# Patient Record
Sex: Female | Born: 1965 | Race: Black or African American | Hispanic: No | Marital: Married | Smoking: Never smoker
Health system: Southern US, Community
[De-identification: ages and names within clinical notes are randomized; demographics above are authoritative.]

## PROBLEM LIST (undated history)

## (undated) DIAGNOSIS — Z8349 Family history of other endocrine, nutritional and metabolic diseases: Secondary | ICD-10-CM

## (undated) DIAGNOSIS — R58 Hemorrhage, not elsewhere classified: Secondary | ICD-10-CM

## (undated) DIAGNOSIS — Z833 Family history of diabetes mellitus: Secondary | ICD-10-CM

## (undated) DIAGNOSIS — B977 Papillomavirus as the cause of diseases classified elsewhere: Secondary | ICD-10-CM

## (undated) DIAGNOSIS — Z823 Family history of stroke: Secondary | ICD-10-CM

## (undated) DIAGNOSIS — Z8261 Family history of arthritis: Secondary | ICD-10-CM

## (undated) DIAGNOSIS — IMO0002 Reserved for concepts with insufficient information to code with codable children: Secondary | ICD-10-CM

## (undated) DIAGNOSIS — R634 Abnormal weight loss: Secondary | ICD-10-CM

## (undated) DIAGNOSIS — D219 Benign neoplasm of connective and other soft tissue, unspecified: Secondary | ICD-10-CM

## (undated) DIAGNOSIS — B9689 Other specified bacterial agents as the cause of diseases classified elsewhere: Secondary | ICD-10-CM

## (undated) DIAGNOSIS — N644 Mastodynia: Secondary | ICD-10-CM

## (undated) DIAGNOSIS — Z8742 Personal history of other diseases of the female genital tract: Secondary | ICD-10-CM

## (undated) DIAGNOSIS — Z8719 Personal history of other diseases of the digestive system: Secondary | ICD-10-CM

## (undated) DIAGNOSIS — N760 Acute vaginitis: Secondary | ICD-10-CM

## (undated) DIAGNOSIS — Z8249 Family history of ischemic heart disease and other diseases of the circulatory system: Secondary | ICD-10-CM

## (undated) DIAGNOSIS — D649 Anemia, unspecified: Secondary | ICD-10-CM

## (undated) DIAGNOSIS — R102 Pelvic and perineal pain: Secondary | ICD-10-CM

## (undated) DIAGNOSIS — I471 Supraventricular tachycardia, unspecified: Secondary | ICD-10-CM

## (undated) DIAGNOSIS — A630 Anogenital (venereal) warts: Secondary | ICD-10-CM

## (undated) DIAGNOSIS — L659 Nonscarring hair loss, unspecified: Secondary | ICD-10-CM

## (undated) DIAGNOSIS — N9089 Other specified noninflammatory disorders of vulva and perineum: Secondary | ICD-10-CM

## (undated) DIAGNOSIS — N840 Polyp of corpus uteri: Secondary | ICD-10-CM

## (undated) DIAGNOSIS — N926 Irregular menstruation, unspecified: Secondary | ICD-10-CM

## (undated) DIAGNOSIS — Z8619 Personal history of other infectious and parasitic diseases: Secondary | ICD-10-CM

## (undated) DIAGNOSIS — R87619 Unspecified abnormal cytological findings in specimens from cervix uteri: Secondary | ICD-10-CM

## (undated) DIAGNOSIS — Z840 Family history of diseases of the skin and subcutaneous tissue: Secondary | ICD-10-CM

## (undated) HISTORY — DX: Family history of ischemic heart disease and other diseases of the circulatory system: Z82.49

## (undated) HISTORY — DX: Abnormal weight loss: R63.4

## (undated) HISTORY — DX: Other specified noninflammatory disorders of vulva and perineum: N90.89

## (undated) HISTORY — DX: Family history of stroke: Z82.3

## (undated) HISTORY — PX: BREAST CYST ASPIRATION: SHX578

## (undated) HISTORY — DX: Unspecified abnormal cytological findings in specimens from cervix uteri: R87.619

## (undated) HISTORY — DX: Anemia, unspecified: D64.9

## (undated) HISTORY — PX: HERNIA REPAIR: SHX51

## (undated) HISTORY — DX: Pelvic and perineal pain: R10.2

## (undated) HISTORY — DX: Hemorrhage, not elsewhere classified: R58

## (undated) HISTORY — DX: Family history of other endocrine, nutritional and metabolic diseases: Z83.49

## (undated) HISTORY — DX: Reserved for concepts with insufficient information to code with codable children: IMO0002

## (undated) HISTORY — DX: Polyp of corpus uteri: N84.0

## (undated) HISTORY — DX: Family history of diabetes mellitus: Z83.3

## (undated) HISTORY — DX: Family history of arthritis: Z82.61

## (undated) HISTORY — DX: Anogenital (venereal) warts: A63.0

## (undated) HISTORY — DX: Benign neoplasm of connective and other soft tissue, unspecified: D21.9

## (undated) HISTORY — DX: Acute vaginitis: N76.0

## (undated) HISTORY — DX: Mastodynia: N64.4

## (undated) HISTORY — DX: Papillomavirus as the cause of diseases classified elsewhere: B97.7

## (undated) HISTORY — DX: Family history of diseases of the skin and subcutaneous tissue: Z84.0

## (undated) HISTORY — DX: Personal history of other diseases of the digestive system: Z87.19

## (undated) HISTORY — DX: Other specified bacterial agents as the cause of diseases classified elsewhere: B96.89

## (undated) HISTORY — DX: Personal history of other diseases of the female genital tract: Z87.42

## (undated) HISTORY — DX: Nonscarring hair loss, unspecified: L65.9

## (undated) HISTORY — DX: Irregular menstruation, unspecified: N92.6

## (undated) HISTORY — PX: ABDOMINOPLASTY: SHX5355

## (undated) HISTORY — DX: Personal history of other infectious and parasitic diseases: Z86.19

---
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
## 1988-03-30 HISTORY — PX: WISDOM TOOTH EXTRACTION: SHX21

## 1997-12-10 ENCOUNTER — Other Ambulatory Visit: Admission: RE | Admit: 1997-12-10 | Discharge: 1997-12-10 | Payer: Self-pay | Admitting: Obstetrics & Gynecology

## 1998-07-26 ENCOUNTER — Ambulatory Visit (HOSPITAL_COMMUNITY): Admission: RE | Admit: 1998-07-26 | Discharge: 1998-07-26 | Payer: Self-pay | Admitting: Gynecology

## 1998-10-16 ENCOUNTER — Encounter: Payer: Self-pay | Admitting: Obstetrics and Gynecology

## 1998-10-16 ENCOUNTER — Ambulatory Visit (HOSPITAL_COMMUNITY): Admission: RE | Admit: 1998-10-16 | Discharge: 1998-10-16 | Payer: Self-pay | Admitting: Obstetrics and Gynecology

## 1998-12-27 ENCOUNTER — Encounter: Payer: Self-pay | Admitting: Obstetrics & Gynecology

## 1998-12-27 ENCOUNTER — Ambulatory Visit (HOSPITAL_COMMUNITY): Admission: RE | Admit: 1998-12-27 | Discharge: 1998-12-27 | Payer: Self-pay | Admitting: Obstetrics & Gynecology

## 1998-12-28 ENCOUNTER — Observation Stay (HOSPITAL_COMMUNITY): Admission: EM | Admit: 1998-12-28 | Discharge: 1998-12-29 | Payer: Self-pay | Admitting: Family Medicine

## 1999-03-09 ENCOUNTER — Inpatient Hospital Stay (HOSPITAL_COMMUNITY): Admission: AD | Admit: 1999-03-09 | Discharge: 1999-03-09 | Payer: Self-pay | Admitting: Obstetrics and Gynecology

## 1999-03-12 ENCOUNTER — Inpatient Hospital Stay (HOSPITAL_COMMUNITY): Admission: AD | Admit: 1999-03-12 | Discharge: 1999-03-14 | Payer: Self-pay | Admitting: Obstetrics and Gynecology

## 1999-03-31 HISTORY — PX: OVARIAN CYST REMOVAL: SHX89

## 1999-05-07 ENCOUNTER — Encounter (INDEPENDENT_AMBULATORY_CARE_PROVIDER_SITE_OTHER): Payer: Self-pay

## 1999-05-07 ENCOUNTER — Ambulatory Visit (HOSPITAL_COMMUNITY): Admission: RE | Admit: 1999-05-07 | Discharge: 1999-05-07 | Payer: Self-pay | Admitting: Obstetrics and Gynecology

## 2000-03-30 DIAGNOSIS — Z8742 Personal history of other diseases of the female genital tract: Secondary | ICD-10-CM

## 2000-03-30 HISTORY — DX: Personal history of other diseases of the female genital tract: Z87.42

## 2000-10-26 ENCOUNTER — Other Ambulatory Visit: Admission: RE | Admit: 2000-10-26 | Discharge: 2000-10-26 | Payer: Self-pay | Admitting: Obstetrics and Gynecology

## 2001-11-22 ENCOUNTER — Other Ambulatory Visit: Admission: RE | Admit: 2001-11-22 | Discharge: 2001-11-22 | Payer: Self-pay | Admitting: Obstetrics and Gynecology

## 2002-01-08 ENCOUNTER — Emergency Department (HOSPITAL_COMMUNITY): Admission: EM | Admit: 2002-01-08 | Discharge: 2002-01-08 | Payer: Self-pay | Admitting: Emergency Medicine

## 2002-11-22 ENCOUNTER — Other Ambulatory Visit: Admission: RE | Admit: 2002-11-22 | Discharge: 2002-11-22 | Payer: Self-pay | Admitting: Obstetrics and Gynecology

## 2002-11-24 ENCOUNTER — Ambulatory Visit (HOSPITAL_COMMUNITY): Admission: RE | Admit: 2002-11-24 | Discharge: 2002-11-24 | Payer: Self-pay | Admitting: Obstetrics and Gynecology

## 2002-11-24 ENCOUNTER — Encounter: Payer: Self-pay | Admitting: Obstetrics and Gynecology

## 2003-03-31 DIAGNOSIS — B9689 Other specified bacterial agents as the cause of diseases classified elsewhere: Secondary | ICD-10-CM

## 2003-03-31 DIAGNOSIS — Z8742 Personal history of other diseases of the female genital tract: Secondary | ICD-10-CM

## 2003-03-31 HISTORY — DX: Personal history of other diseases of the female genital tract: Z87.42

## 2003-03-31 HISTORY — DX: Other specified bacterial agents as the cause of diseases classified elsewhere: B96.89

## 2003-08-02 DIAGNOSIS — N840 Polyp of corpus uteri: Secondary | ICD-10-CM

## 2003-08-02 DIAGNOSIS — Z8742 Personal history of other diseases of the female genital tract: Secondary | ICD-10-CM

## 2003-08-02 HISTORY — DX: Personal history of other diseases of the female genital tract: Z87.42

## 2003-08-02 HISTORY — DX: Polyp of corpus uteri: N84.0

## 2003-12-24 ENCOUNTER — Other Ambulatory Visit: Admission: RE | Admit: 2003-12-24 | Discharge: 2003-12-24 | Payer: Self-pay | Admitting: Obstetrics and Gynecology

## 2004-01-07 ENCOUNTER — Ambulatory Visit (HOSPITAL_COMMUNITY): Admission: RE | Admit: 2004-01-07 | Discharge: 2004-01-07 | Payer: Self-pay | Admitting: Obstetrics and Gynecology

## 2004-01-07 ENCOUNTER — Encounter (INDEPENDENT_AMBULATORY_CARE_PROVIDER_SITE_OTHER): Payer: Self-pay | Admitting: Specialist

## 2004-02-28 DIAGNOSIS — N760 Acute vaginitis: Secondary | ICD-10-CM

## 2004-02-28 HISTORY — DX: Acute vaginitis: N76.0

## 2005-01-14 ENCOUNTER — Emergency Department (HOSPITAL_COMMUNITY): Admission: EM | Admit: 2005-01-14 | Discharge: 2005-01-14 | Payer: Self-pay | Admitting: Emergency Medicine

## 2005-06-09 ENCOUNTER — Other Ambulatory Visit: Admission: RE | Admit: 2005-06-09 | Discharge: 2005-06-09 | Payer: Self-pay | Admitting: Obstetrics and Gynecology

## 2006-01-11 ENCOUNTER — Ambulatory Visit: Payer: Self-pay | Admitting: Internal Medicine

## 2006-03-30 DIAGNOSIS — L659 Nonscarring hair loss, unspecified: Secondary | ICD-10-CM

## 2006-03-30 DIAGNOSIS — Z8742 Personal history of other diseases of the female genital tract: Secondary | ICD-10-CM

## 2006-03-30 HISTORY — DX: Nonscarring hair loss, unspecified: L65.9

## 2006-03-30 HISTORY — DX: Personal history of other diseases of the female genital tract: Z87.42

## 2006-06-15 ENCOUNTER — Emergency Department (HOSPITAL_COMMUNITY): Admission: EM | Admit: 2006-06-15 | Discharge: 2006-06-16 | Payer: Self-pay | Admitting: Emergency Medicine

## 2006-07-29 DIAGNOSIS — R634 Abnormal weight loss: Secondary | ICD-10-CM

## 2006-07-29 HISTORY — DX: Abnormal weight loss: R63.4

## 2007-03-31 DIAGNOSIS — D219 Benign neoplasm of connective and other soft tissue, unspecified: Secondary | ICD-10-CM

## 2007-03-31 HISTORY — DX: Benign neoplasm of connective and other soft tissue, unspecified: D21.9

## 2007-08-12 ENCOUNTER — Emergency Department (HOSPITAL_COMMUNITY): Admission: EM | Admit: 2007-08-12 | Discharge: 2007-08-12 | Payer: Self-pay | Admitting: Emergency Medicine

## 2007-09-03 ENCOUNTER — Inpatient Hospital Stay (HOSPITAL_COMMUNITY): Admission: AD | Admit: 2007-09-03 | Discharge: 2007-09-03 | Payer: Self-pay | Admitting: Obstetrics and Gynecology

## 2007-09-28 DIAGNOSIS — N9089 Other specified noninflammatory disorders of vulva and perineum: Secondary | ICD-10-CM

## 2007-09-28 HISTORY — DX: Other specified noninflammatory disorders of vulva and perineum: N90.89

## 2007-11-16 ENCOUNTER — Emergency Department (HOSPITAL_COMMUNITY): Admission: EM | Admit: 2007-11-16 | Discharge: 2007-11-16 | Payer: Self-pay | Admitting: Emergency Medicine

## 2007-12-08 ENCOUNTER — Ambulatory Visit (HOSPITAL_COMMUNITY): Admission: RE | Admit: 2007-12-08 | Discharge: 2007-12-08 | Payer: Self-pay | Admitting: Obstetrics and Gynecology

## 2007-12-14 ENCOUNTER — Encounter: Admission: RE | Admit: 2007-12-14 | Discharge: 2007-12-14 | Payer: Self-pay | Admitting: Obstetrics and Gynecology

## 2008-02-28 DIAGNOSIS — D649 Anemia, unspecified: Secondary | ICD-10-CM

## 2008-02-28 HISTORY — DX: Anemia, unspecified: D64.9

## 2008-03-30 HISTORY — PX: ABLATION: SHX5711

## 2008-04-02 ENCOUNTER — Ambulatory Visit (HOSPITAL_COMMUNITY): Admission: RE | Admit: 2008-04-02 | Discharge: 2008-04-02 | Payer: Self-pay | Admitting: Obstetrics and Gynecology

## 2008-10-08 ENCOUNTER — Ambulatory Visit (HOSPITAL_BASED_OUTPATIENT_CLINIC_OR_DEPARTMENT_OTHER): Admission: RE | Admit: 2008-10-08 | Discharge: 2008-10-09 | Payer: Self-pay | Admitting: Specialist

## 2009-01-01 ENCOUNTER — Encounter: Admission: RE | Admit: 2009-01-01 | Discharge: 2009-01-01 | Payer: Self-pay | Admitting: Obstetrics and Gynecology

## 2009-03-30 DIAGNOSIS — R58 Hemorrhage, not elsewhere classified: Secondary | ICD-10-CM

## 2009-03-30 HISTORY — DX: Hemorrhage, not elsewhere classified: R58

## 2009-08-09 ENCOUNTER — Emergency Department (HOSPITAL_COMMUNITY): Admission: EM | Admit: 2009-08-09 | Discharge: 2009-08-09 | Payer: Self-pay | Admitting: Emergency Medicine

## 2009-12-11 ENCOUNTER — Encounter: Admission: RE | Admit: 2009-12-11 | Discharge: 2009-12-11 | Payer: Self-pay | Admitting: Obstetrics and Gynecology

## 2010-03-30 DIAGNOSIS — R102 Pelvic and perineal pain: Secondary | ICD-10-CM

## 2010-03-30 HISTORY — DX: Pelvic and perineal pain: R10.2

## 2010-04-19 ENCOUNTER — Encounter: Payer: Self-pay | Admitting: Gastroenterology

## 2010-04-30 DIAGNOSIS — Z8742 Personal history of other diseases of the female genital tract: Secondary | ICD-10-CM

## 2010-04-30 HISTORY — DX: Personal history of other diseases of the female genital tract: Z87.42

## 2010-05-21 ENCOUNTER — Other Ambulatory Visit: Payer: Self-pay | Admitting: Obstetrics and Gynecology

## 2010-05-21 DIAGNOSIS — R102 Pelvic and perineal pain: Secondary | ICD-10-CM

## 2010-05-22 ENCOUNTER — Ambulatory Visit
Admission: RE | Admit: 2010-05-22 | Discharge: 2010-05-22 | Disposition: A | Discharge status: Home / Self Care | Payer: BC Managed Care – PPO | Source: Ambulatory Visit | Attending: Obstetrics and Gynecology | Admitting: Obstetrics and Gynecology

## 2010-05-22 DIAGNOSIS — R102 Pelvic and perineal pain: Secondary | ICD-10-CM

## 2010-05-22 MED ORDER — IOHEXOL 300 MG/ML  SOLN
100.0000 mL | Freq: Once | INTRAMUSCULAR | Status: AC | PRN
  Administered 2010-05-22: 100 mL via INTRAVENOUS

## 2010-07-14 LAB — CBC
MCHC: 32.5 g/dL (ref 30.0–36.0)
MCV: 81.6 fL (ref 78.0–100.0)
Platelets: 255 10*3/uL (ref 150–400)
RDW: 14.1 % (ref 11.5–15.5)

## 2010-07-14 LAB — PREGNANCY, URINE: Preg Test, Ur: NEGATIVE

## 2010-07-23 ENCOUNTER — Emergency Department (HOSPITAL_COMMUNITY): Payer: BC Managed Care – PPO

## 2010-07-23 ENCOUNTER — Emergency Department (HOSPITAL_COMMUNITY)
Admission: EM | Admit: 2010-07-23 | Discharge: 2010-07-23 | Disposition: A | Discharge status: Home / Self Care | Payer: BC Managed Care – PPO | Attending: Emergency Medicine | Admitting: Emergency Medicine

## 2010-07-23 DIAGNOSIS — M549 Dorsalgia, unspecified: Secondary | ICD-10-CM | POA: Insufficient documentation

## 2010-07-23 DIAGNOSIS — M542 Cervicalgia: Secondary | ICD-10-CM | POA: Insufficient documentation

## 2010-07-23 DIAGNOSIS — H538 Other visual disturbances: Secondary | ICD-10-CM | POA: Insufficient documentation

## 2010-07-23 DIAGNOSIS — R51 Headache: Secondary | ICD-10-CM | POA: Insufficient documentation

## 2010-07-23 DIAGNOSIS — R110 Nausea: Secondary | ICD-10-CM | POA: Insufficient documentation

## 2010-07-23 DIAGNOSIS — J3489 Other specified disorders of nose and nasal sinuses: Secondary | ICD-10-CM | POA: Insufficient documentation

## 2010-07-23 DIAGNOSIS — M25519 Pain in unspecified shoulder: Secondary | ICD-10-CM | POA: Insufficient documentation

## 2010-07-23 DIAGNOSIS — H9209 Otalgia, unspecified ear: Secondary | ICD-10-CM | POA: Insufficient documentation

## 2010-08-12 NOTE — Op Note (Signed)
Stephanie Trujillo, Stephanie Trujillo               ACCOUNT NO.:  [id number]   MEDICAL RECORD NO.:  [id number]          PATIENT TYPE:  AMB   LOCATION:  DSC                          FACILITY:  [facility]   PHYSICIAN:  Earvin Hansen L. Shon Hough, M.D.DATE OF BIRTH:  05/11/1965   DATE OF PROCEDURE:  DATE OF DISCHARGE:                               OPERATIVE REPORT   This is a 45 year old lady who has severe abdominal dermatochalasis  status post pregnancies x3.  She also has herniations involving the  midline, ventral areas of the abdomen x2.  She has had discomfort and  irritation and very tender on palpation.   PROCEDURES PERFORMED:  Repair of severe diastasis recti from the xyphoid  to the umbilicus and from the umbilicus to the pubic area, repair of  herniations of the abdomen x2 with pass over this repair.   Anesthesia is general.  The patient underwent general anesthesia  intubated orally.   Preoperatively, the patient was sat up and drawn for the incision for  this repair.  Instead of doing a midline incision on this young lady, we  elected to do a transverse Pfannenstiel-type extension incision.  After  she underwent general anesthesia and intubated orally, prep was done to  the chest, breast, and abdominal areas using Hibiclens soap and solution  walled off with sterile towels and drapes so as to make a sterile field.  A Foley catheter was placed sterilely.  One 4% Xylocaine with  epinephrine was injected locally for vasoconstriction, 200 mL total.  The incision was entered in the Pfannenstiel area, out through the skin  and subcutaneous tissue, down through the Scarpa fascia.  Then, the  dissection was carried up to the umbilical area.  It was released on its  own pedicle, and then on careful dissection it showed a herniation above  at approximately 2 cm in diameter with herniated omentum.  This was  easily decompressed and reduced and down below this was another  herniated area approximately 4-5 cm.   Dissection was carried out to the  xiphoid process exposing the severe diastasis recti.  The diastasis was  repaired with a running #1 Prolene suture for the xiphoid to the  umbilicus and from the umbilicus at the pubic area.  The hernia repairs  were done separately using interrupted sutures of 2-0 Novafil passed all  of the reconstructions.  After proper hemostasis, the flaps were then  placed back down the location and secured with 2-0 Monocryl sutures x2  layers in the subcutaneous plane, then running subcuticular stitch of 3-  0 Monocryl.  The wounds were drained with #10 fully fluted Blake drain  which was placed in the pubic area and secured with 2-0 Prolene.  Steri-  Strips and soft dressing were applied, silicone gel patches, and tape.  Abdominal support with 4 x 4's, ABDs.  She withstood the procedures very  well, was taken to recovery in excellent condition.   ESTIMATED BLOOD LOSS:  Less than 100 mL.   COMPLICATIONS:  None.      Yaakov Guthrie. Shon Hough, M.D.  Electronically Signed  GLT/MEDQ  D:  10/08/2008  T:  10/08/2008  Job:  [id number]

## 2010-08-12 NOTE — Op Note (Signed)
NAMEAMPARO, DONALSON               ACCOUNT NO.:  [id number]   MEDICAL RECORD NO.:  [id number]          PATIENT TYPE:  AMB   LOCATION:  SDC                           FACILITY:  [facility]   PHYSICIAN:  Hal Morales, M.D.DATE OF BIRTH:  08-06-1965   DATE OF PROCEDURE:  04/02/2008  DATE OF DISCHARGE:                               OPERATIVE REPORT   PREOPERATIVE DIAGNOSES:  1. Menorrhagia.  2. Pedunculated anterior uterine fibroid.  3. Dysmenorrhea.  4. Pelvic pain.   POSTOPERATIVE DIAGNOSES:  1. Menorrhagia.  2. Subserosal and intramural fibroids.  3. Dysmenorrhea.  4. Pelvic pain.  5. Endometriosis.   PROCEDURE:  ThermaChoice endometrial ablation, laparoscopy with lysis of  adhesions and drainage of bilateral ovarian endometriomas.   SURGEON:  Vanessa P. Pennie Rushing, MD   FIRST ASSISTANT:  Dois Davenport A. Rivard, MD   ANESTHESIA:  General orotracheal.   ESTIMATED BLOOD LOSS:  Less than 15 mL.   COMPLICATIONS:  None.   FINDINGS:  The uterus sounded to 8 cm at the time of endometrial  ablation.  At the time of laparoscopy, the patient was noted to have a 6-  cm anterior uterine fibroid which was subserosal and intramural, but no  pedunculated fibroids were noted.  The tubes bilaterally were adherent  with filmy adhesions to the posterior uterus.  On the left-hand side,  the ovary was adherent to the posterior uterus and contained several  endometriomas.  The posterior cul-de-sac likewise contained peritoneal  windows and adhesions between the posterior uterus and the rectum.  On  the right side, the right ovary was completely retroperitoneal with  adherent endometriomas.  The tube was adherent by filmy adhesions.   PROCEDURE:  The patient was taken to the operating room after  appropriate identification and placed on the operating table.  After the  attainment of adequate general anesthesia, she was placed in the  modified lithotomy position.  The abdomen, perineum, and vagina  were  prepped with multiple layers of Betadine and a Foley catheter inserted  into the bladder and then connected to straight drainage.  The abdomen  and perineum were draped as sterile fields.  A Graves speculum was  placed in the vagina and a paracervical block achieved with a total of  10 mL of 2% Xylocaine in the 5 and 7 o'clock positions.  A single-tooth  tenaculum was then placed.  The uterus was sounded to 8 cm.  The  ThermaChoice endometrial ablation catheter was then primed and inserted  into the uterine cavity.  The endometrial cavity balloon was then filled  with approximately 12 mL of D5W to a pressure of 180 mmHg.  The fluid  was then heated to 87 degrees and the treatment cycle of 8 minutes  completed.  Once the fluid had cooled, it was withdrawn into the syringe  and the ThermaChoice balloon removed from the endometrial cavity.  All  instruments were then removed from the vagina and a Hulka tenaculum  placed on the anterior cervix.  The surgeon changed gloves and the  abdomen became the site of operation.  The subumbilical and  suprapubic  injections of 0.25% Marcaine for total of 10 mL were undertaken.  A  subumbilical incision was made and a Veress cannula placed through that  incision into the peritoneal cavity.  A pneumoperitoneum was created  with 2 liters of CO2.  The Veress cannula was removed and the  laparoscopic trocar placed through the subumbilical incision.  The  laparoscope was placed through the trocar sleeve and the above-noted  findings made and documented.  Laparoscopic probe trocars were placed  through suprapubic incisions under direct visualization.  Lysis of  adhesions was carried out on the left side, freeing the left fallopian  tube and the left ovary from their adherence to the posterior uterus.  The endometrioma that caused adherence of the left ovary to the  posterior uterus was drained and all findings documented.  At this  point, the ovary was  free of any adhesive attachments.  The right adnexa  could not be as well visualized; however, the chocolate cyst on the  right ovary was drained and copious irrigation carried out until the  fluid was completely clear.  At this point, hemostasis was noted to be  adequate and after copious irrigation approximately 100 mL of warm  lactated Ringer's was left in the peritoneal cavity.  All instruments  were then removed from the peritoneal cavity under direct visualization  as the CO2 was allowed to escape.  The subumbilical incision was closed  with a fascial suture of 0 Vicryl and a subcuticular suture of 3-0  Vicryl.  The suprapubic incisions were closed with subcuticular sutures  of 3-0 Vicryl.  Sterile dressings were applied.  The Hulka tenaculum was  removed.  The patient was awakened from general anesthesia and taken to  the recovery room in satisfactory condition having tolerated the  procedure well with sponge and instrument counts correct.   DISCHARGE INSTRUCTIONS:  Printed instructions for laparoscopy from the  [facility].   DISCHARGE MEDICATIONS:  1. Ibuprofen 600 mg p.o. q.6h. for 5 days then p.r.n.  2. Vicodin 1-2 p.o. q.4h. p.r.n. pain.   FOLLOWUP:  The patient will follow up with Dr. Pennie Rushing in 2 weeks.      Hal Morales, M.D.  Electronically Signed     VPH/MEDQ  D:  04/02/2008  T:  04/03/2008  Job:  [id number]

## 2010-08-15 NOTE — Assessment & Plan Note (Signed)
[facility]                               PULMONARY OFFICE NOTE   NAME:Rembert, ZARIE KOSIBA                      MRN:          [id number]  DATE:01/11/2006                            DOB:          [birth year]    PULMONARY CONSULTATION:   REASON FOR CONSULTATION:  Abnormal chest x-ray.   HISTORY:  This is a 45 year old black female who has been having  intermittent chest discomfort dating back several years.  When first asked  about a pattern, said there was not one in terms of onset, frequency,  location, duration, exacerbating or alleviating factors.  She said the  reason she was here was because a chest x-ray was done during one of her  chest pains that lasted for several seconds and went away and has not  recurred.  This chest x-ray was reported to be abnormal, and that is the  reason she is sent to be seen.  None of the chest pains have a consist  pleuritic nature, but the patient has noticed decline in exercise tolerance  over the last several years associated with dyspnea but no exacerbation of  pain.   PAST MEDICAL HISTORY:  Significant for the absence of major illnesses. She  is status post C section and laparoscopic surgery.   ALLERGIES:  CODEINE.   MEDICATIONS:  Multivitamins and calcium.   SOCIAL HISTORY:  She has never smoked.  She works as a Education officer, environmental  and an Optician, dispensing.   FAMILY HISTORY:  Positive for allergies and asthma.   REVIEW OF SYSTEMS:  Taken in details and significant for abdominal bloating.   PHYSICAL EXAMINATION:  GENERAL: This is an anxious black female who failed  to answer a single question I asked her in a straightforward fashion and  finally stopped me from additional history by saying, You are asking too  many questions.  HEENT:  Unremarkable.  Pharynx clear.  Nasal turbinates normal.  Ear canals  clear bilaterally.  NECK: Supple without cervical adenopathy or tenderness.  Trachea midline.  LUNGS: Lung  fields perfectly clear bilaterally to auscultation.  CARDIOVASCULAR: Regular rhythm without murmur, gallop, or rub.  ABDOMEN: Soft, benign.  EXTREMITIES: Warm without calf tenderness, cyanosis, clubbing, or edema.   She brought with her an x-ray from September 25 that shows minimal  granulomatous changes.   IMPRESSION:  1. Migratory chest discomfort that sometimes has pleuritic features and      sometimes does not, lasts for several seconds, then shifts from side to      side is strongly indicative of irritable bowel syndrome in this patient      who complains of abdominal bloating on calcium.  I have recommended she      stop the calcium in the short run, take Citrucel 1 spoon b.i.d., and      consider a CT scan of the chest looking for occult interstitial lung      disease or thromboembolic disease (which would be very unlikely given      the migratory nature of the pain) and a CPST to see if she has  any      reproducible dyspnea for which a physiologic limitation might be      identified.   She seemed very focused on the only reason she is here is because of a  chest x-ray which I thought was the least of her problems and certainty  does not have anything to do with the migratory fleeting chest discomfort  she has been experiencing for the last several years, and this particular  problem does not need further pulmonary evaluation.  If the overread by  radiology is worrisome, I would proceed directly to a CT scan looking for  granulomatous disease such as sarcoid, but even if she has sarcoid, would  not recommend pursuing a diagnosis related to the pain or dyspnea that she  is experiencing, but rather conservative followup.            ______________________________  Charlaine Dalton. Sherene Sires, MD, [facility]      MBW/MedQ  DD:  01/11/2006  DT:  01/12/2006  Job #:  [id number]   cc:   [facility]  Merlene Laughter. Renae Gloss, M.D.

## 2010-08-15 NOTE — Op Note (Signed)
Stephanie Trujillo, Stephanie Trujillo               ACCOUNT NO.:  [id number]   MEDICAL RECORD NO.:  [id number]          PATIENT TYPE:  AMB   LOCATION:  SDC                           FACILITY:  [facility]   PHYSICIAN:  Hal Morales, M.D.DATE OF BIRTH:  07-21-1965   DATE OF PROCEDURE:  01/07/2004  DATE OF DISCHARGE:                                 OPERATIVE REPORT   PREOPERATIVE DIAGNOSES:  Menorrhagia, endometrial lesions.   POSTOPERATIVE DIAGNOSES:  Menorrhagia, endometrial polyps.   OPERATION:  Hysteroscopy D&C, removal of endometrial polyps.   ANESTHESIA:  General oral tracheal.   ESTIMATED BLOOD LOSS:  Less than 25 mL.   COMPLICATIONS:  None.   FINDINGS:  The uterus sounded to 9 cm. There were no intracavitary fibroids.  There were several endometrial polyps that were noted and removed.   DESCRIPTION OF PROCEDURE:  The patient was taken to the operating room after  appropriate identification placed on the operating table. After the  attainment of adequate general anesthesia, she was placed in lithotomy  position.  The perineum and vagina were prepped with multiple layers of  Betadine and draped as a sterile field. A red Robinson catheter was used to  empty the bladder. A Graves speculum was placed in the vagina and a single  tooth tenaculum placed on the anterior cervix.  A paracervical block was  achieved with a total of 10 mL of 2% Xylocaine in the 5 and 7 o'clock  positions. The uterus was sounded, the cervix was dilated to #23 dilator and  the diagnostic hysteroscope used to evaluate the endometrial cavity. Several  polypoid lesions were seen near the internal os and these were removed with  Randall stone forceps. Curettage of the entire endometrial cavity revealed a  large amount of endometrial tissue. Repeat hysteroscopy revealed no  subsequent endometrial lesions.  All instruments were removed from the  vagina and the patient awakened from general anesthesia and taken to the  recovery  room in satisfactory condition having tolerated the procedure well  with sponge and instrument counts correct.   SPECIMENS TO PATHOLOGY:  Endometrial polyps and endometrial curettings.      VPH/MEDQ  D:  01/07/2004  T:  01/08/2004  Job:  [id number]

## 2010-08-15 NOTE — Op Note (Signed)
[facility]  Patient:    Stephanie, Trujillo                      MRN: [id number] Proc. Date: 05/07/99 Adm. Date:  [id number] Attending:  Dierdre Forth Pearline                           Operative Report  PREOPERATIVE DIAGNOSIS:  Persistent right adnexal mass, right ovarian cyst.  POSTOPERATIVE DIAGNOSIS:  Right ovarian cystic teratoma.  OPERATION:  Operative laparoscopy, right ovarian cystectomy.  SURGEON:  Vanessa P. Pennie Rushing, M.D.  ANESTHESIA:  General orotracheal.  ESTIMATED BLOOD LOSS:  Less than 50 cc.  COMPLICATIONS:  None.  FINDINGS:  The uterus was within normal limits.  The tubes appeared normal bilaterally.  The left ovary appeared normal.  The right ovary contained a 1.5 m cystic mass within the substance of the ovary which on removal appeared to be a  cystic teratoma in that it contained sebaceous material and hair.  DESCRIPTION OF PROCEDURE:  The patient was taken to the operating room after appropriate identification and placed on the operating table.  After the attainment of adequate general anesthesia, she was placed in the modified lithotomy position. The abdomen, perineum and vagina were prepped with multiple layers of Betadine nd a single-toothed tenaculum placed on the anterior cervix.  The Foley catheter was placed into the bladder and connected to straight drainage.  The abdomen was draped as a sterile field.  The subumbilical area was infiltrated with 5 cc of 0.25% Marcaine.  An incision was made in that area and a Veress cannula placed through that incision into peritoneal cavity.  A pneumoperitoneum was created with 2.0L of CO2.  The Veress cannula was removed and the laparoscopic trocar placed through  that incision into the peritoneal cavity.  The laparoscope was placed through the trocar sleeve.  Suprapubic incisions were made to the right and left of midline and laparoscopic probe trocars placed through  those incisions into the peritoneal cavity under direct visualization.  The above-noted findings were made and documented and the right ovary elevated with a self-retaining retractor grasper. The Harmonic scalpel was used to incise the ovarian cortex down to the level of the cyst.  Hydrodissection was used to allow the cyst to be removed with gentle traction and intact.  A toothed grasping forcep was then used to remove the ovarian cyst through the laparoscopic sleeve and remove from the operative field. Copious irrigation was carried out and hemostasis in the ovary appeared to be adequate.  Copious irrigation was again carried out and approximately 60 cc of warm lactated Ringers left in the peritoneal cavity.  All instruments were then removed from he peritoneal cavity under direct visualization as the CO2 was allowed to escape. The suprapubic incisions were closed with subcuticular sutures of 3-0 Vicryl.  The subumbilical incision was closed first with figure-of-eight suture in the peritoneum, then a running suture in the fascia and a subcuticular suture in the skin incision.  These layers could be visualized because of the patients fairly  recent postpartum state and the thinness of the abdominal layers in the umbilical area.  Sterile dressings were applied and the single-toothed tenaculum removed.  The Foley catheter was removed and the patient awakened from general anesthesia and taken to the recovery room in satisfactory condition having tolerated the procedure well with sponge and instrument counts correct.  Prior to incising  the ovary, washings were obtained from the pelvis and sent for cytopathology.  SPECIMENS:  Peritoneal washings and right ovarian cyst. DD:  05/07/99 TD:  05/07/99 Job: [id number]

## 2010-08-15 NOTE — H&P (Signed)
NAMEDOUGLAS, Trujillo                         ACCOUNT NO.:  [id number]   MEDICAL RECORD NO.:  [id number]                   PATIENT TYPE:  AMB   LOCATION:  SDC                                  FACILITY:  [facility]   PHYSICIAN:  Hal Morales, M.D.             DATE OF BIRTH:  20-Nov-1965   DATE OF ADMISSION:  08/02/2003  DATE OF DISCHARGE:                                HISTORY & PHYSICAL   HISTORY OF PRESENT ILLNESS:  Stephanie Trujillo is a 45 year old married African-  American female, para 3-0-0-3, who presents for hysteroscopy with polyp and  fibroid resection, because of menorrhagia.  For approximately six months  this past year the patient experienced heavy vaginal bleeding during her  menses, which lasted for seven days, two of which she changed a maxi pad  every hour.  The remainder of her menses was characterized by clotting,  though minimal cramping, and the change of a maxi pad every three hours.  From September 2004 until March 2005 the patient's menstrual flow was  relatively regular; however, in April 2005 she had daily spotting for three  weeks, followed by a heavy seven-day menstrual flow at the end.  Additionally, the patient has begun to experience post-coital bleeding as  well.   A sonohistogram in November 2004 revealed two echogenic foci in the  endometrial canal, measuring 1.5 cm and 1.0 cm respectively.  In addition,  there was a fibroid located at the lower uterine segment of the uterus  anteriorly, which measured 1.3 cm.  The patient's ovaries appeared within  normal limits.  She denies any significant cramping, dyspareunia, urinary  tract symptoms, nausea, vomiting, diarrhea or fever during these episodes.  Her PSH done in August 2004 was within normal limits.  After consideration  of sonographic findings, along with her recent symptoms of postcoital  bleeding and menstrual irregularity (coupled with menorrhagia).  The patient  has consented to undergo hysteroscopy  with polyps/fibroid resection.   OBSTETRICAL HISTORY:  Gravida 3, para 3-0-0-3.  The patient had a cesarean  section in 1995.   GYNECOLOGIC HISTORY:  Menarche 45 years old.  The patient's menstrual flow  is as mentioned in history of present illness.  She uses vasectomy as a  method of contraception.  She has a history of abnormal Pap smears, which  was treated by laser surgery in 1988.  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  The patient's last normal Pap smear was August 2004, as  was her last normal mammogram.   PAST MEDICAL HISTORY:  Irritable bowel syndrome and asymptomatic heart  murmur.   PAST SURGICAL HISTORY:  1. 1995 cesarean section.  2. 2000 diagnostic laparoscopy to rule out ectopic pregnancy -- the patient     was found to have a ruptured ovarian cyst.  3. 2001 laparoscopic ovarian cystectomy -- dermoid.  Denies any history of blood transfusion or problems with anesthesia.  FAMILY HISTORY:  Thyroid disease, lupus, hypertension, diabetes mellitus,  stroke and rheumatoid arthritis.   SOCIAL HISTORY:  The patient is married and she works as a Social research officer, government at [employer]   HABITS:  The patient does not use alcohol nor tobacco.   CURRENT MEDICATIONS:  1. Multivitamins one tablet q.d.  2. Calcium 500 mg q.d.   ALLERGIES:  CODEINE (which causes her to stop breathing).   REVIEW OF SYSTEMS:  The patient does have problems with postnasal drainage  and occasional sinus infection, otherwise review of systems is negative  except as mentioned in the history of present illness.   PHYSICAL EXAMINATION:  VITAL SIGNS:  Blood pressure 104/70, weight 144,  height 5 feet 2-1/4 inches.  NECK:  Supple without masses.  There is no thyromegaly or adenopathy.  HEART:  Regular rate and rhythm.  There is no murmur.  LUNGS:  Clear; there are no wheezes, rales or rhonchi.  BACK:  No CVA tenderness.  ABDOMEN:  Bowel sounds are present.  It is soft without tenderness,  guarding,  rebound or organomegaly.  EXTREMITIES:  Without clubbing, cyanosis or edema.  PELVIC:  EGBUS is within normal limits.  Vagina is rugos.  Cervix is  nontender without lesions; it is everted.  Uterus normal size, shape and  consistency; without tenderness.  Rectovaginal examination without palpable  masses or tenderness.   ASSESSMENT:  1. Menorrhagia.  2. Endometrial polyps.   DISPOSITION:  A discussion was held with the patient regarding the  indications for her procedure, along with this risks, which include but are  not limited to:  reaction to anesthesia, damage to adjacent organs,  infection and excessive bleeding.  The patient also was prescribed Cytotec  400 mcg to be used per vagina six hours prior to her cervical procedure, in  order to facilitate cervical dilatation.  The patient has consented to  proceed with diagnostic hysteroscopy, with resection of polyp and fibroid at  [facility] on July 15, 2003 at 1:15 p.m.     [name].                    Hal Morales, M.D.    EJP/MEDQ  D:  08/02/2003  T:  08/02/2003  Job:  [id number]

## 2010-08-15 NOTE — H&P (Signed)
[facility]  Patient:    Stephanie Trujillo                       MRN: [id number] Adm. Date:  [id number] Attending:  Shaune Spittle Dictator:   Vance Gather Duplantis, C.N.M.                         History and Physical  HISTORY OF PRESENT ILLNESS:   Ms. Neidert is a 45 year old married black female,  gravida 3, para 2-0-0-2, at 37-4/7 weeks, who presents complaining of uterine contractions every three minutes for the last couple of hours, with reports of intermittent contractions for the last couple of days.  She denies any leaking ut has noted some bloody show over the last day or so.  She reports positive fetal  movement.  She denies any nausea, vomiting, headaches or visual disturbances. er pregnancy has been followed at [facility] by the M.D. service and as been complicated by history of previous cesarean section, followed by a successful vaginal birth after cesarean section and desiring a vaginal birth with this pregnancy if possible.  She also reports a history of a heart murmur, history of irritable bowel syndrome and a history of laser surgery on her cervix.  She desires a bilateral tubal ligation following this pregnancy also.  She is group B strep  negative.  OB/GYN HISTORY:               She is a gravida 3, para 2-0-0-2 who delivered a viable female infant in September of 1995 who weighed 8 pounds 6 ounces at 38-weeks gestation following a 10-hour labor.  She delivered by C-section secondary to CPD. In June of 1998, she delivered a viable female infant who weighed 7 pounds 10 ounces at 37-weeks gestation with a vacuum-assisted delivery.  She had laser surgery in 1998.  She had a diagnostic laparoscopy in April of 2000 and was diagnosed with an ovarian teratoma.  ALLERGIES:                    She is allergic to CODEINE -- it gives her swelling and difficulty breathing.  GENERAL MEDICAL HISTORY:      She reports  having had the usual childhood diseases. She has a heart murmur that gives her palpitations at times but does not take antibiotics.  She reports a history of varicosities in the past.  She reports occasional urinary tract infections and a history of irritable bowel syndrome but rarely requires medication.  She reports a history of eye numbness with epidural that lasted until three and a half weeks after delivery.  PAST SURGICAL HISTORY:        Her only surgeries are a laparoscopy in April of 2000, wisdom teeth removed in 1990 and a C-section in 1995.  GENETIC HISTORY:              Negative.  SOCIAL HISTORY:               She is married to [facility], who is involved and  supportive.  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 They are of the [employer] faith.  They deny any illicit drug use, alcohol or smoking with this pregnancy.  PRENATAL LABORATORY DATA:     Her blood type is not on the chart.  Sickle cell s negative.  Syphilis is nonreactive.  Rubella  is immune.  Hepatitis B surface antigen is negative.  HIV is nonreactive.  GC and Chlamydia are both negative.  One-hour glucola is within normal range.  Maternal serum alpha-fetoprotein was within normal range and 36-weeks beta strep was negative.  PHYSICAL EXAMINATION:  VITAL SIGNS:                  Her blood pressure is 124/72.  Her pulse is 107. Her respirations are 24.  Temperature is 97.2.  HEENT:                        Grossly within normal limits.  HEART:                        Regular rate and rhythm.  CHEST:                        Clear.  BREASTS:                      Soft and nontender.  ABDOMEN:                      Gravid with uterine contractions noted every three to five minutes.  Her fetal heart rate is reassuring but not strictly reactive.  PELVIC:                       Her cervix is 6 to 7 cm, 90% effaced, vertex out f the pelvis and bulging membrane.  EXTREMITIES:                   Within normal limits.  ASSESSMENT:                   1. Intrauterine pregnancy at term.                               2. Active labor.                               3. Negative group B streptococcus.  PLAN:                         Her plan is to admit to labor and delivery, to notify Vanessa P. Haygood, M.D. of patients admission and to follow routine M.D. orders. DD:  03/12/99 TD:  03/12/99 Job: [id number]

## 2010-08-29 DIAGNOSIS — N644 Mastodynia: Secondary | ICD-10-CM

## 2010-08-29 HISTORY — DX: Mastodynia: N64.4

## 2010-12-22 ENCOUNTER — Other Ambulatory Visit (HOSPITAL_COMMUNITY): Payer: Self-pay | Admitting: Obstetrics and Gynecology

## 2010-12-22 ENCOUNTER — Other Ambulatory Visit: Payer: Self-pay | Admitting: Obstetrics and Gynecology

## 2010-12-22 DIAGNOSIS — Z1231 Encounter for screening mammogram for malignant neoplasm of breast: Secondary | ICD-10-CM

## 2010-12-25 LAB — URINALYSIS, ROUTINE W REFLEX MICROSCOPIC
Glucose, UA: NEGATIVE
Ketones, ur: NEGATIVE
Nitrite: NEGATIVE
Urobilinogen, UA: 0.2
pH: 6

## 2010-12-25 LAB — URINE CULTURE: Colony Count: >=100000

## 2010-12-25 LAB — URINE MICROSCOPIC-ADD ON

## 2011-01-01 ENCOUNTER — Ambulatory Visit
Admission: RE | Admit: 2011-01-01 | Discharge: 2011-01-01 | Disposition: A | Discharge status: Home / Self Care | Payer: BC Managed Care – PPO | Source: Ambulatory Visit | Attending: Obstetrics and Gynecology | Admitting: Obstetrics and Gynecology

## 2011-01-01 DIAGNOSIS — Z1231 Encounter for screening mammogram for malignant neoplasm of breast: Secondary | ICD-10-CM

## 2011-06-04 ENCOUNTER — Encounter (INDEPENDENT_AMBULATORY_CARE_PROVIDER_SITE_OTHER): Payer: BC Managed Care – PPO | Admitting: Obstetrics and Gynecology

## 2011-06-04 ENCOUNTER — Other Ambulatory Visit: Payer: BC Managed Care – PPO

## 2011-06-04 DIAGNOSIS — D259 Leiomyoma of uterus, unspecified: Secondary | ICD-10-CM

## 2011-06-04 DIAGNOSIS — N949 Unspecified condition associated with female genital organs and menstrual cycle: Secondary | ICD-10-CM

## 2011-06-04 DIAGNOSIS — N809 Endometriosis, unspecified: Secondary | ICD-10-CM

## 2011-08-27 ENCOUNTER — Ambulatory Visit (INDEPENDENT_AMBULATORY_CARE_PROVIDER_SITE_OTHER): Payer: BC Managed Care – PPO | Admitting: Obstetrics and Gynecology

## 2011-08-27 ENCOUNTER — Encounter: Payer: Self-pay | Admitting: Obstetrics and Gynecology

## 2011-08-27 ENCOUNTER — Telehealth: Payer: Self-pay | Admitting: Obstetrics and Gynecology

## 2011-08-27 VITALS — BP 110/70 | Temp 98.1°F | Ht 62.5 in | Wt 165.0 lb

## 2011-08-27 DIAGNOSIS — N6310 Unspecified lump in the right breast, unspecified quadrant: Secondary | ICD-10-CM

## 2011-08-27 DIAGNOSIS — D259 Leiomyoma of uterus, unspecified: Secondary | ICD-10-CM

## 2011-08-27 DIAGNOSIS — D219 Benign neoplasm of connective and other soft tissue, unspecified: Secondary | ICD-10-CM

## 2011-08-27 DIAGNOSIS — N8000 Endometriosis of the uterus, unspecified: Secondary | ICD-10-CM

## 2011-08-27 DIAGNOSIS — B009 Herpesviral infection, unspecified: Secondary | ICD-10-CM

## 2011-08-27 DIAGNOSIS — N809 Endometriosis, unspecified: Secondary | ICD-10-CM | POA: Insufficient documentation

## 2011-08-27 DIAGNOSIS — N9489 Other specified conditions associated with female genital organs and menstrual cycle: Secondary | ICD-10-CM

## 2011-08-27 DIAGNOSIS — R102 Pelvic and perineal pain: Secondary | ICD-10-CM

## 2011-08-27 DIAGNOSIS — N63 Unspecified lump in unspecified breast: Secondary | ICD-10-CM

## 2011-08-27 MED ORDER — VALACYCLOVIR HCL 1 G PO TABS: ORAL_TABLET | ORAL | Status: DC

## 2011-08-27 NOTE — Progress Notes (Signed)
Pt states,"symptoms of endometriosis has improved with a change in diet and exercise". Pt never began Loestrin 1/20 as discussed from 06/04/11 OV and only used Vagifem x 1 insertion. Pt wants to reevaluate right breast lump from last year @[facility] . The lump has decreased in size;however is still tender. No nipple discharge.  .  Subjective:     Stephanie Trujillo is an 46 y.o. female who presents for evaluation of a breast mass. Change was noted 2 months ago, and has been gradually improving since first identified. Patient does routinely do self breast exams. The mass does not change during menstrual cycle. The mass is tender. Patient denies nipple discharge. Breast cancer risk factors include: recurrent breast cysts Also has hx of pelvic pain, with endometriosis and fibroids documented at laparoscopy.  Has less pain and vaginal dryness since last visit without using the recommended therapy.  Did have cystic lesion on [place] last visit and wants followup of that finding. Has long hx of excessive gas with everything she eats.  Has been evaluated with colonoscopy 6 years ago and has made some change in her diet, but flatulence continues.  She denies nausea, vomiting or change in bowel habits.  She denies blood in stools  The following portions of the patient's history were reviewed and updated as appropriate: allergies, current medications, past family history, past medical history, past social history, past surgical history and problem list.  Review of Systems Pertinent items are noted in HPI.     Objective:    General appearance: alert and no distress Back: no CVAT Breasts: normal appearance, no masses or tenderness, Normal to palpation without dominant masses, on the left.  The right breast contains a 3x2 cm cystic tender mass at the 10-11 o'clock position. Abdomen: soft, non-tender; bowel sounds normal; no masses,  no organomegaly Pelvic: cervix normal in appearance, external genitalia normal, perianal skin:  no external genital warts noted, positive findings: uterine enlargement or about 8 weeks size, decreased mobility,  No separable adnexal masses,., rectovaginal septum normal and vagina normal without discharge    Assessment:    breast mass, likely benign and cystic  Fibroids Endometriosis, sx improved, suggestion of adnexal cystic lesion on [place] ? Hydrosalpinx vs endometrioma Atrophic vaginal sx improved   Plan:    Reassured the patient that the finding is most likely benign. Arranged for mammogram. Arranged for ultrasound. Of breast Explained circumstances under which drainage will be considered  Pelvic ultrasound with followup

## 2011-08-28 ENCOUNTER — Ambulatory Visit
Admission: RE | Admit: 2011-08-28 | Discharge: 2011-08-28 | Disposition: A | Discharge status: Home / Self Care | Payer: BC Managed Care – PPO | Source: Ambulatory Visit | Attending: Obstetrics and Gynecology | Admitting: Obstetrics and Gynecology

## 2011-08-28 DIAGNOSIS — N6310 Unspecified lump in the right breast, unspecified quadrant: Secondary | ICD-10-CM

## 2011-08-28 NOTE — Telephone Encounter (Signed)
Tc from pt. Informed pt rx not sent to pharm yesterday due to pharmacy not being selected in pt's chart. Rx for Valtrex (see meds list) called to [facility]) [phone]. Pt agrees.

## 2011-09-08 ENCOUNTER — Ambulatory Visit (INDEPENDENT_AMBULATORY_CARE_PROVIDER_SITE_OTHER): Payer: BC Managed Care – PPO | Admitting: Obstetrics and Gynecology

## 2011-09-08 ENCOUNTER — Ambulatory Visit (INDEPENDENT_AMBULATORY_CARE_PROVIDER_SITE_OTHER): Payer: BC Managed Care – PPO

## 2011-09-08 ENCOUNTER — Encounter: Payer: Self-pay | Admitting: Obstetrics and Gynecology

## 2011-09-08 VITALS — BP 110/70 | Temp 98.9°F | Ht 62.5 in | Wt 164.0 lb

## 2011-09-08 DIAGNOSIS — N809 Endometriosis, unspecified: Secondary | ICD-10-CM

## 2011-09-08 DIAGNOSIS — D259 Leiomyoma of uterus, unspecified: Secondary | ICD-10-CM

## 2011-09-08 DIAGNOSIS — D219 Benign neoplasm of connective and other soft tissue, unspecified: Secondary | ICD-10-CM

## 2011-09-08 DIAGNOSIS — N9489 Other specified conditions associated with female genital organs and menstrual cycle: Secondary | ICD-10-CM

## 2011-09-08 NOTE — Progress Notes (Signed)
Follow up ultrasound for right adnexal mass today.   SUBJECTIVE: Minimal pelvic discomfort now.  Wants to remain without medication for endometriosis, and declines treatment for fibroids at this time.Verbalizes understanding of possible progression pf endometriosis without treatment.  VS: BP 110/70  Temp 98.9 F (37.2 C)  Ht 5' 2.5" (1.588 m)  Wt 164 lb (74.39 kg)  BMI 29.52 kg/m2  LMP 08/20/2011  ULTRASOUND:the uterus measures 13.4 x 4.8 x 10.2 cm. There is a large anterior fibroid measuring 6.3 x 6.4 x 6.7 cm. The right ovary is within normal limits and the cystic mass has resolved from her last ultrasound. The left ovary contains a complex cyst consistent with hemorrhage or endometriosis. It measures 4.3 x 3.2 x 3.6 cm. There is a question of a left hydrosalpinx directly adjacent to this left ovarian enlargement.  IMPRESSION: Endometriosis which is minimally symptomatic per patient assessment Uterine fibroids with unknown contribution to the patient's symptoms Patient desires no management of either diagnosis at this time.  Recommendation: Observation only. The patient is to contact us for further evaluation and treatment if she desires prior to her annual examination. Otherwise she will followup with her annual examination.

## 2011-09-21 ENCOUNTER — Telehealth: Payer: Self-pay | Admitting: Obstetrics and Gynecology

## 2011-09-21 NOTE — Telephone Encounter (Signed)
Triage/epic 

## 2011-09-22 NOTE — Telephone Encounter (Signed)
Tc to pt per telephone call. Pt wants to know if vph would like for her to decrease her daily Valtrex regimen of 1000mg  daily to 500mg  daily. Pt has noticed increased headaches and dizziness after start of Valtrex 1000mg  daily. Pt states,"this is one of the side affects of Valtrex". 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 Will consult with vph per recs. Pt will continue regimen of 1000mg  daily until further notified by vh.

## 2011-09-22 NOTE — Telephone Encounter (Signed)
[facility]PH PT 

## 2011-09-28 ENCOUNTER — Telehealth: Payer: Self-pay

## 2011-09-28 NOTE — Telephone Encounter (Signed)
Tc to pt per vph recs. Pt informed per vph, may use Valtrex 500mg  daily if desires. Pt voices understanding.

## 2011-09-28 NOTE — Telephone Encounter (Signed)
Lm on vm to cb per vph recs rgdg Valtrex use.

## 2011-11-11 ENCOUNTER — Telehealth: Payer: Self-pay | Admitting: Obstetrics and Gynecology

## 2011-11-11 NOTE — Telephone Encounter (Signed)
Tc to pt per telephone call. Pt c/o late menses x 8 days. No abn abd pain. Informed pt needs to take a pregnancy test and needs a 3 month follow up per vh from 05/2011 OV. Pt voices understanding. Pt will cb only if upt positive today. Appt sched 11/23/11 with vph for eval.

## 2011-11-11 NOTE — Telephone Encounter (Signed)
vph pt 

## 2011-11-23 ENCOUNTER — Ambulatory Visit (INDEPENDENT_AMBULATORY_CARE_PROVIDER_SITE_OTHER): Payer: BC Managed Care – PPO | Admitting: Obstetrics and Gynecology

## 2011-11-23 ENCOUNTER — Encounter: Payer: Self-pay | Admitting: Obstetrics and Gynecology

## 2011-11-23 VITALS — BP 128/72 | Ht 62.5 in | Wt 170.0 lb

## 2011-11-23 DIAGNOSIS — N809 Endometriosis, unspecified: Secondary | ICD-10-CM

## 2011-11-23 DIAGNOSIS — D219 Benign neoplasm of connective and other soft tissue, unspecified: Secondary | ICD-10-CM

## 2011-11-23 DIAGNOSIS — D259 Leiomyoma of uterus, unspecified: Secondary | ICD-10-CM

## 2011-11-23 NOTE — Progress Notes (Signed)
GYN PROBLEM VISIT  Ms. Stephanie Trujillo is a 46 y.o. year old female,G3P3003, who presents for a problem visit.   Subjective:  Pt here to f/u on visit in 07/2011 for amenorrhea. PMP was 10/09/11. This cycle started on 11/15/11 through today.  Prior to this time cycles had always been every 23 days.  No heavy or painful bleeding. The patient notes the onset of a headache associated with her last several cycles.  These  headaches have not been responsive to over-the-counter medications. She denies any mood swings, and the cycles are now  quite manageable  Objective:  BP 128/72  Ht 5' 2.5" (1.588 m)  Wt 170 lb (77.111 kg)  BMI 30.60 kg/m2  LMP 11/15/2011     Assessment: Endometriosis Uterine fibroids Delay in menses Improved pelvic pain  Plan: The patient wanted to continue observation only. Ibuprofen 600 mg every 6 hours is recommended at the immediate onset of premenstrual headaches. He will follow up in 3 months, and at that time her annual examination will likewise be done     Dierdre Forth, MD  11/23/2011 5:09 PM

## 2012-01-11 ENCOUNTER — Other Ambulatory Visit: Payer: BC Managed Care – PPO

## 2012-02-19 ENCOUNTER — Other Ambulatory Visit: Payer: Self-pay | Admitting: Obstetrics and Gynecology

## 2012-02-19 DIAGNOSIS — Z1231 Encounter for screening mammogram for malignant neoplasm of breast: Secondary | ICD-10-CM

## 2012-03-07 ENCOUNTER — Encounter: Payer: Self-pay | Admitting: Obstetrics and Gynecology

## 2012-03-07 ENCOUNTER — Ambulatory Visit (INDEPENDENT_AMBULATORY_CARE_PROVIDER_SITE_OTHER): Payer: BC Managed Care – PPO | Admitting: Obstetrics and Gynecology

## 2012-03-07 VITALS — BP 122/70 | HR 70 | Resp 16 | Ht 62.0 in | Wt 170.0 lb

## 2012-03-07 DIAGNOSIS — R102 Pelvic and perineal pain: Secondary | ICD-10-CM

## 2012-03-07 DIAGNOSIS — D219 Benign neoplasm of connective and other soft tissue, unspecified: Secondary | ICD-10-CM

## 2012-03-07 DIAGNOSIS — N809 Endometriosis, unspecified: Secondary | ICD-10-CM

## 2012-03-07 DIAGNOSIS — Z124 Encounter for screening for malignant neoplasm of cervix: Secondary | ICD-10-CM

## 2012-03-07 DIAGNOSIS — D259 Leiomyoma of uterus, unspecified: Secondary | ICD-10-CM

## 2012-03-07 NOTE — Addendum Note (Signed)
Addended by: Marla Roe A on: 03/07/2012 04:22 PM   Modules accepted: Orders

## 2012-03-07 NOTE — Progress Notes (Signed)
Last Pap: 11/11/2010 WNL: Yes NEG HR HPV DONE 2011.  NEXT PAP WITH HR HPV DUE 2014 Regular Periods:yes Contraception: Husband-Vas  Monthly Breast exam:yes Tetanus<33yrs:no Nl.Bladder Function:yes Daily BMs:yes Healthy Diet:yes Calcium:no Mammogram:yes Date of Mammogram: 08/2011 Exercise:No Have often Exercise: NA Seatbelt: yes Abuse at home: no Stressful work:yes Sigmoid-colonoscopy: 2008 WNL Bone Density: No PCP: Andi Devon Change in PMH: Low Vit-d Change in ZOX:WRUE Subjective:    Stephanie Trujillo is a 46 y.o. female, G3P3003, who presents for an annual exam. Complains of erratic episodes of abdominal and pelvic pain, always relieved by OTC meds.  Has some crampy pain, usually relieved by BMs. Some frictional dyspareunia, but feels sex life is good.   History   Social History  . Marital Status: Married    Spouse Name: N/A    Number of Children: N/A  . Years of Education: N/A   Social History Main Topics  . Smoking status: Never Smoker   . Smokeless tobacco: Never Used  . Alcohol Use: No  . Drug Use: No  . Sexually Active: Yes    Birth Control/ Protection: Surgical     Comment: husband with vasectomy    Other Topics Concern  . None   Social History Narrative  . None    Menstrual cycle:   LMP: Patient's last menstrual period was 03/06/2012.           Cycle: Erratic.  Last cycle was 38 days.  Has hormonal swings with episodes of decreased vaginal lubrication  The following portions of the patient's history were reviewed and updated as appropriate: allergies, current medications, past family history, past medical history, past social history, past surgical history and problem list.  Review of Systems Pertinent items are noted in HPI. Breast:Negative for breast lump,nipple discharge or nipple retraction Gastrointestinal: Negative for abdominal pain, change in bowel habits or rectal bleeding Urinary:negative   Objective:    BP 122/70  Pulse 70  Resp 16   Ht 5\' 2"  (1.575 m)  Wt 170 lb (77.111 kg)  BMI 31.09 kg/m2  LMP 03/06/2012    Weight:  Wt Readings from Last 1 Encounters:  03/07/12 170 lb (77.111 kg)          BMI: Body mass index is 31.09 kg/(m^2).  General Appearance: Alert, appropriate appearance for age. No acute distress HEENT: Grossly normal Neck / Thyroid: Supple, no masses, nodes or enlargement Lungs: clear to auscultation bilaterally Back: No CVA tenderness Breast Exam: No masses or nodes.No dimpling, nipple retraction or discharge. Cardiovascular: Regular rate and rhythm. S1, S2, no murmur Gastrointestinal: Soft, non-tender, no masses or organomegaly Pelvic Exam: External genitalia: normal general appearance Vaginal: normal rugae and blood in vault Cervix: blood from os Adnexa: not palpable mass Uterus: irregular enlargement Rectovaginal: normal rectal, no masses and no uterosacral masses Lymphatic Exam: Non-palpable nodes in neck, clavicular, axillary, or inguinal regions Skin: no rash or abnormalities Neurologic: Normal gait and speech, no tremor  Psychiatric: Alert and oriented, appropriate affect.   Wet Prep:not applicable Urinalysis:not applicable UPT: Not done   Assessment:    Perimenopausal bleeding pattern and symptoms  Endometriosis with erratic symptoms, Probable left ovarian endometrioma from U/S 06/04/11.  Most symptoms relieved by OTC MEDS   Plan:    mammogram pap smear due 2014 with HR HPV Reviewed options for management of endometriosis.  Pt declines management for now RTO 3 mos for followup STD screening: declined Contraception:vasectomy   Dierdre Forth MD

## 2012-03-08 LAB — PAP IG W/ RFLX HPV ASCU

## 2012-03-22 ENCOUNTER — Ambulatory Visit
Admission: RE | Admit: 2012-03-22 | Discharge: 2012-03-22 | Disposition: A | Discharge status: Home / Self Care | Payer: BC Managed Care – PPO | Source: Ambulatory Visit | Attending: Obstetrics and Gynecology | Admitting: Obstetrics and Gynecology

## 2012-03-22 DIAGNOSIS — Z1231 Encounter for screening mammogram for malignant neoplasm of breast: Secondary | ICD-10-CM

## 2012-03-30 ENCOUNTER — Encounter: Payer: Self-pay | Admitting: Obstetrics and Gynecology

## 2012-03-30 DIAGNOSIS — R922 Inconclusive mammogram: Secondary | ICD-10-CM | POA: Insufficient documentation

## 2012-04-04 ENCOUNTER — Other Ambulatory Visit: Payer: Self-pay | Admitting: Obstetrics and Gynecology

## 2012-04-04 DIAGNOSIS — R928 Other abnormal and inconclusive findings on diagnostic imaging of breast: Secondary | ICD-10-CM

## 2012-04-08 ENCOUNTER — Other Ambulatory Visit: Payer: Self-pay | Admitting: Obstetrics and Gynecology

## 2012-04-08 ENCOUNTER — Ambulatory Visit
Admission: RE | Admit: 2012-04-08 | Discharge: 2012-04-08 | Disposition: A | Discharge status: Home / Self Care | Payer: BC Managed Care – PPO | Source: Ambulatory Visit | Attending: Obstetrics and Gynecology | Admitting: Obstetrics and Gynecology

## 2012-04-08 ENCOUNTER — Encounter: Payer: Self-pay | Admitting: Obstetrics and Gynecology

## 2012-04-08 DIAGNOSIS — R928 Other abnormal and inconclusive findings on diagnostic imaging of breast: Secondary | ICD-10-CM

## 2012-04-11 ENCOUNTER — Encounter: Payer: Self-pay | Admitting: Obstetrics and Gynecology

## 2012-04-11 DIAGNOSIS — N6002 Solitary cyst of left breast: Secondary | ICD-10-CM | POA: Insufficient documentation

## 2012-04-18 ENCOUNTER — Ambulatory Visit
Admission: RE | Admit: 2012-04-18 | Discharge: 2012-04-18 | Disposition: A | Discharge status: Home / Self Care | Payer: BC Managed Care – PPO | Source: Ambulatory Visit | Attending: Obstetrics and Gynecology | Admitting: Obstetrics and Gynecology

## 2012-04-18 ENCOUNTER — Other Ambulatory Visit: Payer: Self-pay | Admitting: Obstetrics and Gynecology

## 2012-04-18 ENCOUNTER — Encounter: Payer: Self-pay | Admitting: Obstetrics and Gynecology

## 2012-04-18 DIAGNOSIS — R928 Other abnormal and inconclusive findings on diagnostic imaging of breast: Secondary | ICD-10-CM

## 2013-01-18 ENCOUNTER — Other Ambulatory Visit: Payer: Self-pay | Admitting: Obstetrics and Gynecology

## 2013-01-18 DIAGNOSIS — N6001 Solitary cyst of right breast: Secondary | ICD-10-CM

## 2013-02-01 ENCOUNTER — Other Ambulatory Visit: Payer: BC Managed Care – PPO

## 2013-02-15 ENCOUNTER — Ambulatory Visit
Admission: RE | Admit: 2013-02-15 | Discharge: 2013-02-15 | Disposition: A | Discharge status: Home / Self Care | Payer: BC Managed Care – PPO | Source: Ambulatory Visit | Attending: Obstetrics and Gynecology | Admitting: Obstetrics and Gynecology

## 2013-02-15 DIAGNOSIS — N6001 Solitary cyst of right breast: Secondary | ICD-10-CM

## 2013-04-11 ENCOUNTER — Other Ambulatory Visit: Payer: Self-pay

## 2013-04-11 DIAGNOSIS — Z1231 Encounter for screening mammogram for malignant neoplasm of breast: Secondary | ICD-10-CM

## 2013-05-01 ENCOUNTER — Other Ambulatory Visit: Payer: Self-pay

## 2013-05-01 ENCOUNTER — Ambulatory Visit
Admission: RE | Admit: 2013-05-01 | Discharge: 2013-05-01 | Disposition: A | Discharge status: Home / Self Care | Payer: BC Managed Care – PPO | Source: Ambulatory Visit

## 2013-05-01 DIAGNOSIS — Z1231 Encounter for screening mammogram for malignant neoplasm of breast: Secondary | ICD-10-CM

## 2014-01-29 ENCOUNTER — Encounter: Payer: Self-pay | Admitting: Obstetrics and Gynecology

## 2014-02-23 DIAGNOSIS — D251 Intramural leiomyoma of uterus: Secondary | ICD-10-CM | POA: Insufficient documentation

## 2014-03-09 DIAGNOSIS — N398 Other specified disorders of urinary system: Secondary | ICD-10-CM | POA: Insufficient documentation

## 2014-04-17 DIAGNOSIS — R0789 Other chest pain: Secondary | ICD-10-CM | POA: Insufficient documentation

## 2014-04-17 DIAGNOSIS — L7682 Other postprocedural complications of skin and subcutaneous tissue: Secondary | ICD-10-CM | POA: Insufficient documentation

## 2014-05-16 ENCOUNTER — Other Ambulatory Visit: Payer: Self-pay

## 2014-05-16 DIAGNOSIS — Z1231 Encounter for screening mammogram for malignant neoplasm of breast: Secondary | ICD-10-CM

## 2014-05-29 ENCOUNTER — Ambulatory Visit
Admission: RE | Admit: 2014-05-29 | Discharge: 2014-05-29 | Disposition: A | Discharge status: Home / Self Care | Payer: BLUE CROSS/BLUE SHIELD | Source: Ambulatory Visit

## 2014-05-29 DIAGNOSIS — Z1231 Encounter for screening mammogram for malignant neoplasm of breast: Secondary | ICD-10-CM

## 2014-08-14 ENCOUNTER — Encounter (HOSPITAL_COMMUNITY): Payer: Self-pay | Admitting: Emergency Medicine

## 2014-08-14 ENCOUNTER — Emergency Department (HOSPITAL_COMMUNITY)
Admission: EM | Admit: 2014-08-14 | Discharge: 2014-08-14 | Disposition: A | Discharge status: Home / Self Care | Payer: BLUE CROSS/BLUE SHIELD | Attending: Emergency Medicine | Admitting: Emergency Medicine

## 2014-08-14 DIAGNOSIS — Z7952 Long term (current) use of systemic steroids: Secondary | ICD-10-CM | POA: Diagnosis not present

## 2014-08-14 DIAGNOSIS — Z862 Personal history of diseases of the blood and blood-forming organs and certain disorders involving the immune mechanism: Secondary | ICD-10-CM | POA: Insufficient documentation

## 2014-08-14 DIAGNOSIS — Z8719 Personal history of other diseases of the digestive system: Secondary | ICD-10-CM | POA: Diagnosis not present

## 2014-08-14 DIAGNOSIS — Z8673 Personal history of transient ischemic attack (TIA), and cerebral infarction without residual deficits: Secondary | ICD-10-CM | POA: Insufficient documentation

## 2014-08-14 DIAGNOSIS — Z8742 Personal history of other diseases of the female genital tract: Secondary | ICD-10-CM | POA: Insufficient documentation

## 2014-08-14 DIAGNOSIS — I471 Supraventricular tachycardia: Secondary | ICD-10-CM

## 2014-08-14 DIAGNOSIS — I10 Essential (primary) hypertension: Secondary | ICD-10-CM | POA: Insufficient documentation

## 2014-08-14 DIAGNOSIS — Z8639 Personal history of other endocrine, nutritional and metabolic disease: Secondary | ICD-10-CM | POA: Diagnosis not present

## 2014-08-14 DIAGNOSIS — E119 Type 2 diabetes mellitus without complications: Secondary | ICD-10-CM | POA: Insufficient documentation

## 2014-08-14 DIAGNOSIS — R011 Cardiac murmur, unspecified: Secondary | ICD-10-CM | POA: Insufficient documentation

## 2014-08-14 DIAGNOSIS — Z87440 Personal history of urinary (tract) infections: Secondary | ICD-10-CM | POA: Insufficient documentation

## 2014-08-14 DIAGNOSIS — Z86018 Personal history of other benign neoplasm: Secondary | ICD-10-CM | POA: Diagnosis not present

## 2014-08-14 DIAGNOSIS — Z8739 Personal history of other diseases of the musculoskeletal system and connective tissue: Secondary | ICD-10-CM | POA: Diagnosis not present

## 2014-08-14 DIAGNOSIS — Z79899 Other long term (current) drug therapy: Secondary | ICD-10-CM | POA: Diagnosis not present

## 2014-08-14 DIAGNOSIS — R000 Tachycardia, unspecified: Secondary | ICD-10-CM | POA: Diagnosis present

## 2014-08-14 DIAGNOSIS — Z8619 Personal history of other infectious and parasitic diseases: Secondary | ICD-10-CM | POA: Insufficient documentation

## 2014-08-14 LAB — CBC WITH DIFFERENTIAL/PLATELET
BASOS PCT: 1 % (ref 0–1)
Basophils Absolute: 0 10*3/uL (ref 0.0–0.1)
EOS PCT: 2 % (ref 0–5)
Eosinophils Absolute: 0.1 10*3/uL (ref 0.0–0.7)
HEMATOCRIT: 39.9 % (ref 36.0–46.0)
HEMOGLOBIN: 13 g/dL (ref 12.0–15.0)
Lymphocytes Relative: 24 % (ref 12–46)
Lymphs Abs: 1.6 10*3/uL (ref 0.7–4.0)
MCH: 25.9 pg — AB (ref 26.0–34.0)
MCHC: 32.6 g/dL (ref 30.0–36.0)
MCV: 79.6 fL (ref 78.0–100.0)
MONO ABS: 0.4 10*3/uL (ref 0.1–1.0)
MONOS PCT: 7 % (ref 3–12)
NEUTROS ABS: 4.5 10*3/uL (ref 1.7–7.7)
Neutrophils Relative %: 68 % (ref 43–77)
Platelets: 253 10*3/uL (ref 150–400)
RBC: 5.01 MIL/uL (ref 3.87–5.11)
RDW: 14.4 % (ref 11.5–15.5)
WBC: 6.6 10*3/uL (ref 4.0–10.5)

## 2014-08-14 LAB — BASIC METABOLIC PANEL
Anion gap: 9 (ref 5–15)
BUN: 11 mg/dL (ref 6–20)
CALCIUM: 8.8 mg/dL — AB (ref 8.9–10.3)
CO2: 24 mmol/L (ref 22–32)
Chloride: 106 mmol/L (ref 101–111)
Creatinine, Ser: 0.93 mg/dL (ref 0.44–1.00)
GFR calc Af Amer: >60 mL/min (ref >60)
GLUCOSE: 90 mg/dL (ref 65–99)
Potassium: 3.9 mmol/L (ref 3.5–5.1)
Sodium: 139 mmol/L (ref 135–145)

## 2014-08-14 MED ORDER — METOPROLOL SUCCINATE ER 25 MG PO TB24: 25.0000 mg | ORAL_TABLET | Freq: Every day | ORAL | Status: DC

## 2014-08-14 NOTE — ED Notes (Signed)
Pt's primary MD-- Dorien Chihuahua, MD at [facility].

## 2014-08-14 NOTE — ED Provider Notes (Signed)
CSN: [id number]     Arrival date & time 08/14/14  1021 History   First MD Initiated Contact with Patient 08/14/14 1022     Chief Complaint  Patient presents with  . Tachycardia     (Consider location/radiation/quality/duration/timing/severity/associated sxs/prior Treatment) Patient is a 49 y.o. female presenting with palpitations. The history is provided by the patient and the EMS personnel.  Palpitations Palpitations quality:  Fast Onset quality:  Sudden Duration:  5 minutes Timing:  Constant Progression:  Resolved Chronicity:  New Context: not anxiety, not caffeine and not stimulant use   Relieved by: Adenosine 6 mg x 1 given by EMS. Worsened by:  Nothing Ineffective treatments:  None tried Associated symptoms: chest pressure and shortness of breath   Associated symptoms: no back pain, no chest pain, no cough, no diaphoresis, no dizziness, no leg pain, no lower extremity edema, no malaise/fatigue, no nausea, no numbness, no syncope, no vomiting and no weakness     Past Medical History  Diagnosis Date  . Abnormal Pap smear 1988 & 2004    laser surgery '88  . Hx of irritable bowel syndrome   . Heart murmur     asymptomatic  . FH: thyroid disease   . FH: lupus   . FH: hypertension   . FH: diabetes mellitus   . FH: stroke   . FH: rheumatoid arthritis   . H/O: menorrhagia 08/02/2003  . Endometrial polyp 08/02/03  . 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 H/O varicella   . Condylomata acuminata   . H/O candidiasis   . Hx: UTI (urinary tract infection) 11/1995  . Irregular menses   . H/O cervicitis 2002  . Bacterial vaginosis 2005  . H/O dysmenorrhea 2005  . 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 History of fatigue 2008  . Hx of ovarian cyst 2008  . Hair loss 2008  . Weight loss 07/2006  . Fibroid 2009  . Vulvar lesion 09/2007  . Anemia 02/2008  . Ecchymosis 2011  . Pelvic pain in female 2012    transient  . H/O vaginal bleeding 04/2010  . Mastodynia, female 08/2010    transient    Past  Surgical History  Procedure Laterality Date  . Cesarean section  1995  . Ovarian cyst removal  2001  . Wisdom tooth extraction  1990   Family History  Problem Relation Age of Onset  . Diabetes Paternal Grandmother    History  Substance Use Topics  . Smoking status: Never Smoker   . Smokeless tobacco: Never Used  . Alcohol Use: No   OB History    Gravida Para Term Preterm AB TAB SAB Ectopic Multiple Living   3 3 3       3      Review of Systems  Constitutional: Negative for fever, chills, malaise/fatigue, diaphoresis, appetite change and fatigue.  Eyes: Negative for visual disturbance.  Respiratory: Positive for shortness of breath. Negative for cough and chest tightness.   Cardiovascular: Positive for palpitations. Negative for chest pain, leg swelling and syncope.  Gastrointestinal: Negative for nausea, vomiting, abdominal pain, diarrhea and abdominal distention.  Genitourinary: Negative for dysuria and difficulty urinating.  Musculoskeletal: Negative for back pain, neck pain and neck stiffness.  Skin: Negative for color change, pallor and rash.  Neurological: Negative for dizziness, syncope, weakness, light-headedness, numbness and headaches.  All other systems reviewed and are negative.     Allergies  Codeine  Home Medications   Prior to Admission medications   Medication Sig Start Date  End Date Taking? Authorizing Provider  fluocinonide cream (LIDEX) 1.09 % Apply 1 application topically 2 (two) times daily.   Yes Historical Provider, MD  triamcinolone cream (KENALOG) 0.1 % Apply 1 application topically 2 (two) times daily.   Yes Historical Provider, MD  valACYclovir (VALTREX) 500 MG tablet Take 500 mg by mouth daily.   Yes Historical Provider, MD  metoprolol succinate (TOPROL-XL) 25 MG 24 hr tablet Take 1 tablet (25 mg total) by mouth daily. 08/14/14 09/14/14  Ellwood Dense, MD  valACYclovir (VALTREX) 1000 MG tablet ONE TABLET DAILY. With prodromal symptoms, begin 2  tablets twice daily for five days, then resume one table daily Patient not taking: Reported on 08/14/2014 08/27/11   Eldred Manges, MD   BP 104/70 mmHg  Pulse 84  Temp(Src) 98.5 F (36.9 C) (Oral)  Resp 15  SpO2 100%  LMP 07/28/2014 Physical Exam  Constitutional: She is oriented to person, place, and time. She appears well-developed and well-nourished. No distress.  HENT:  Head: Normocephalic and atraumatic.  Mouth/Throat: Oropharynx is clear and moist.  Eyes: Conjunctivae and EOM are normal. Pupils are equal, round, and reactive to light.  Neck: Normal range of motion. Neck supple.  Cardiovascular: Normal rate, regular rhythm, normal heart sounds and intact distal pulses.  Exam reveals no gallop and no friction rub.   No murmur heard. Pulmonary/Chest: Effort normal and breath sounds normal. No respiratory distress. She has no wheezes. She has no rales.  Abdominal: Soft. Bowel sounds are normal. She exhibits no distension. There is no tenderness. There is no rebound and no guarding.  Musculoskeletal: Normal range of motion. She exhibits no edema or tenderness.  Neurological: She is alert and oriented to person, place, and time. GCS eye subscore is 4. GCS verbal subscore is 5. GCS motor subscore is 6.  Skin: Skin is warm and dry. No rash noted. She is not diaphoretic. No erythema. No pallor.  Nursing note and vitals reviewed.   ED Course  Procedures (including critical care time) Labs Review Labs Reviewed  CBC WITH DIFFERENTIAL/PLATELET - Abnormal; Notable for the following:    MCH 25.9 (*)    All other components within normal limits  BASIC METABOLIC PANEL - Abnormal; Notable for the following:    Calcium 8.8 (*)    All other components within normal limits    Imaging Review No results found.   EKG Interpretation   Date/Time:  Tuesday Aug 14 2014 10:25:45 EDT Ventricular Rate:  100 PR Interval:  150 QRS Duration: 67 QT Interval:  326 QTC Calculation: 420 R Axis:    67 Text Interpretation:  Sinus tachycardia RSR' in V1 or V2, probably normal  variant Baseline wander in lead(s) V1 V4 No significant change since last  tracing Confirmed by KNAPP  MD-J, JON ([id number]) on 08/14/2014 10:46:35 AM      MDM   Final diagnoses:  SVT (supraventricular tachycardia)    49 yo F with no significant PMH presenting with SVT.  Pt reports sudden onset of palpitations described as heart beating fast and not slowing down;  Mild associated chest pressure and dyspnea.  Pt states she has had palpitations in the past but they have never lasted as long.  Lasted about 5 mins then she called EMS.  EMS reports HR in 180s on their arrival. Review of their 12-lead shows narrow complex regular tachycardia. Pt given Adenosine 6 mg after which she converted to NSR with HR in 90s.  Normotensive, VSS en route.  Pt  reports resolution of chest pain, SOB, and palpitations with conversion to NSR.  Reports she sought evaluation for palpitations in the past but reports normal workups previously.  No recent fevers, infections.  On presentation, pt alert, AFVSS, in NAD.  CV exam with RRR, no m/r/g.  Lungs clear.  No LE edema.  No other acute findings.  Will check labs and observe.  Lab results show no acute abnormalities. Pt observed and remaining in NSR with HR 90s.  Will initiate Metoprolol, prescription provided, and have pt f/u with Cardiology and PCP.  ED return precautions given for recurrent palpitations, chest pain, SOB, dizziness, or any other concerns.  No other questions, stable at discharge.  Discussed with attending Dr. Tomi Bamberger.    Ellwood Dense, MD 08/14/14 [phone]

## 2014-08-14 NOTE — ED Provider Notes (Signed)
Pt presents to the ED with an episode of SVT.    EMS treated with adenosine prior to arrival with good result. Physical Exam  BP 138/92 mmHg  Pulse 97  Temp(Src) 98.5 F (36.9 C) (Oral)  Resp 18  SpO2 100%  LMP 07/28/2014  Physical Exam  Constitutional: She appears well-developed and well-nourished. No distress.  HENT:  Head: Normocephalic and atraumatic.  Right Ear: External ear normal.  Left Ear: External ear normal.  Eyes: Conjunctivae are normal. Right eye exhibits no discharge. Left eye exhibits no discharge. No scleral icterus.  Neck: Neck supple. No tracheal deviation present.  Cardiovascular: Normal rate.   Pulmonary/Chest: Effort normal. No stridor. No respiratory distress.  Musculoskeletal: She exhibits no edema.  Neurological: She is alert. Cranial nerve deficit: no gross deficits.  Skin: Skin is warm and dry. No rash noted.  Psychiatric: She has a normal mood and affect.  Nursing note and vitals reviewed.   ED Course  Procedures  EKG Interpretation  Date/Time:  Tuesday Aug 14 2014 10:25:45 EDT Ventricular Rate:  100 PR Interval:  150 QRS Duration: 67 QT Interval:  326 QTC Calculation: 420 R Axis:   67 Text Interpretation:  Sinus tachycardia RSR' in V1 or V2, probably normal variant Baseline wander in lead(s) V1 V4 No significant change since last tracing Confirmed by Paulino Cork  MD-J, Edwena Mayorga ([id number]) on 08/14/2014 10:46:35 AM       MDM Will monitor for a short period of time in the ED to make sure she does not have recurrent episodes.  Plan on outpatient cardiology referral.  I saw and evaluated the patient, reviewed the resident's note and I agree with the findings and plan.        Dorie Rank, MD 08/14/14 [phone]

## 2014-08-14 NOTE — ED Notes (Signed)
To ED via [facility] from home with c/o SVT while at home-- with chest pressure and diaphoresis at same time. Received 6mg  Adenosine IV with successful conversion.

## 2014-08-16 ENCOUNTER — Ambulatory Visit (INDEPENDENT_AMBULATORY_CARE_PROVIDER_SITE_OTHER): Payer: BLUE CROSS/BLUE SHIELD | Admitting: Cardiovascular Disease

## 2014-08-16 ENCOUNTER — Encounter: Payer: Self-pay | Admitting: Cardiovascular Disease

## 2014-08-16 ENCOUNTER — Telehealth: Payer: Self-pay | Admitting: Cardiovascular Disease

## 2014-08-16 ENCOUNTER — Encounter: Payer: Self-pay | Admitting: *Deleted

## 2014-08-16 VITALS — BP 118/82 | HR 74 | Ht 62.0 in | Wt 166.0 lb

## 2014-08-16 DIAGNOSIS — M7989 Other specified soft tissue disorders: Secondary | ICD-10-CM

## 2014-08-16 DIAGNOSIS — R079 Chest pain, unspecified: Secondary | ICD-10-CM | POA: Diagnosis not present

## 2014-08-16 DIAGNOSIS — G4730 Sleep apnea, unspecified: Secondary | ICD-10-CM

## 2014-08-16 DIAGNOSIS — I471 Supraventricular tachycardia: Secondary | ICD-10-CM | POA: Diagnosis not present

## 2014-08-16 DIAGNOSIS — Z87898 Personal history of other specified conditions: Secondary | ICD-10-CM

## 2014-08-16 DIAGNOSIS — Z9289 Personal history of other medical treatment: Secondary | ICD-10-CM

## 2014-08-16 MED ORDER — DILTIAZEM HCL 30 MG PO TABS: 30.0000 mg | ORAL_TABLET | ORAL | Status: DC | PRN

## 2014-08-16 NOTE — Progress Notes (Signed)
[name], female   DOB: 10/27/1965, 49 y.o.   MRN: [id number]       CARDIOLOGY CONSULT NOTE  [name] MRN: [id number] DOB/AGE: 07/13/1965 49 y.o.  Admit date: (Not on file) Primary Physician DAY,TEMPLE V, MD  Reason for Consultation: PSVT  HPI: The patient is a 49 year old woman was recently evaluated in the ED for SVT. It was reportedly associated with chest pressure, SOB, and diaphoresis. She reportedly received adenosine prior to arrival which aborted [name]. ECG performed on 08/14/14 demonstrated sinus tachycardia, heart rate 100 bpm. Basic metabolic panel and CBC were unremarkable.  She tells me she has experienced palpitations for over 22 years but nothing was as severe or long lasting as her most recent episode 2 days ago. She has been told by her husband that she sometimes gasps for breath at night and a sleep study was ordered which has not been obtained yet. She tells me she is vitamin D deficient and she may be hypothyroid which is being managed by her PCP. She believes her episode on Tuesday lasted for 45 minutes. She tried vagal maneuvers which did not work. She was told her heart rate was 220 bpm. She has also had episodes of awakening with chest pain. She tries to exercise regularly. She said she has hyperlipidemia but tries to eat healthy. She denies excessive caffeine use. She has noted some bilateral feet swelling in the past 2 weeks as well.  She was prescribed Toprol-XL 25 mg daily but has not taken this yet.  She is traveling out of the country in 2 weeks.  Soc: Nonsmoker. Married.    Allergies  Allergen Reactions  . Codeine Shortness Of Breath    Stops breathing    Current Outpatient Prescriptions  Medication Sig Dispense Refill  . fluocinonide cream (LIDEX) 0.93 % Apply 1 application topically 2 (two) times daily.    . metoprolol succinate (TOPROL-XL) 25 MG 24 hr tablet Take 1 tablet (25 mg total) by mouth daily. 30  tablet 0  . triamcinolone cream (KENALOG) 0.1 % Apply 1 application topically 2 (two) times daily.    . valACYclovir (VALTREX) 1000 MG tablet ONE TABLET DAILY. With prodromal symptoms, begin 2 tablets twice daily for five days, then resume one table daily 60 tablet 12  . valACYclovir (VALTREX) 500 MG tablet Take 500 mg by mouth daily.     No current facility-administered medications for this visit.    Past Medical History  Diagnosis Date  . Abnormal Pap smear 1988 & 2004    laser surgery '88  . Hx of irritable bowel syndrome   . Heart murmur     asymptomatic  . FH: thyroid disease   . FH: lupus   . FH: hypertension   . FH: diabetes mellitus   . FH: stroke   . FH: rheumatoid arthritis   . H/O: menorrhagia 08/02/2003  . Endometrial polyp 08/02/03  . 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 H/O varicella   . Condylomata acuminata   . H/O candidiasis   . Hx: UTI (urinary tract infection) 11/1995  . Irregular menses   . H/O cervicitis 2002  . Bacterial vaginosis 2005  . H/O dysmenorrhea 2005  . 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 History of fatigue 2008  . Hx of ovarian cyst 2008  . Hair loss 2008  . Weight loss 07/2006  . Fibroid 2009  . Vulvar lesion 09/2007  . Anemia 02/2008  . Ecchymosis 2011  .  Pelvic pain in female 2012    transient  . H/O vaginal bleeding 04/2010  . Mastodynia, female 08/2010    transient     Past Surgical History  Procedure Laterality Date  . Cesarean section  1995  . Ovarian cyst removal  2001  . Wisdom tooth extraction  1990    History   Social History  . Marital Status: Married    Spouse Name: N/A  . Number of Children: N/A  . Years of Education: N/A   Occupational History  . Not on file.   Social History Main Topics  . Smoking status: Never Smoker   . Smokeless tobacco: Never Used  . Alcohol Use: No  . Drug Use: No  . Sexual Activity: Yes    Birth Control/ Protection: Surgical     Comment: husband with vasectomy    Other Topics Concern  . Not on file     Social History Narrative     No family history of premature CAD in 1st degree relatives.  Prior to Admission medications   Medication Sig Start Date End Date Taking? Authorizing Provider  fluocinonide cream (LIDEX) 7.61 % Apply 1 application topically 2 (two) times daily.   Yes Historical Provider, MD  metoprolol succinate (TOPROL-XL) 25 MG 24 hr tablet Take 1 tablet (25 mg total) by mouth daily. 08/14/14 09/14/14 Yes Ellwood Dense, MD  triamcinolone cream (KENALOG) 0.1 % Apply 1 application topically 2 (two) times daily.   Yes Historical Provider, MD  valACYclovir (VALTREX) 1000 MG tablet ONE TABLET DAILY. With prodromal symptoms, begin 2 tablets twice daily for five days, then resume one table daily 08/27/11  Yes Eldred Manges, MD  valACYclovir (VALTREX) 500 MG tablet Take 500 mg by mouth daily.   Yes Historical Provider, MD     Review of systems complete and found to be negative unless listed above in HPI     Physical exam Blood pressure 118/82, pulse 74, height 5\' 2"  (1.575 m), weight 166 lb (75.297 kg), last menstrual period 07/28/2014, SpO2 99 %. General: NAD Neck: No JVD, no thyromegaly or thyroid nodule.  Lungs: Clear to auscultation bilaterally with normal respiratory effort. CV: Nondisplaced PMI. Regular rate and rhythm, normal S1/S2, no S3/S4, no murmur.  No peripheral edema.  No carotid bruit.  Normal pedal pulses.  Abdomen: Soft, nontender, no hepatosplenomegaly, no distention.  Skin: Intact without lesions or rashes.  Neurologic: Alert and oriented x 3.  Psych: Normal affect. Extremities: No clubbing or cyanosis.  HEENT: Normal.   ECG: Most recent ECG reviewed.  Labs:   Lab Results  Component Value Date   WBC 6.6 08/14/2014   HGB 13.0 08/14/2014   HCT 39.9 08/14/2014   MCV 79.6 08/14/2014   PLT 253 08/14/2014    Recent Labs Lab 08/14/14 1112  NA 139  K 3.9  CL 106  CO2 24  BUN 11  CREATININE 0.93  CALCIUM 8.8*  GLUCOSE 90   No results found  for: CKTOTAL, CKMB, CKMBINDEX, TROPONINI No results found for: CHOL No results found for: HDL No results found for: LDLCALC No results found for: TRIG No results found for: CHOLHDL No results found for: LDLDIRECT       Studies: No results found.  ASSESSMENT AND PLAN:  1. Paroxysmal SVT: We discussed a variety of approaches including both pharmacotherapy as well as potentially curative ablation. For the time being, I will prescribe diltiazem 30 mg to be used as needed for palpitations. I also educated her  on vagal maneuvers. If they were to increase in frequency or duration, I would then consider daily pharmacotherapy perhaps with short acting diltiazem. If the arrhythmia is recalcitrant to pharmacotherapy, would then proceed with an EP referral for ablation consideration. The patient is in agreement with this plan.  2. Chest pain and feet swelling: Given the frequency of episodes, will proceed with an echocardiogram and exercise treadmill stress test for further clarification.  3. Sleep apnea: Encouraged to proceed with sleep study.  Dispo: f/u 6 weeks.   Signed: Kate Sable, M.D., F.A.C.C.  08/16/2014, 3:06 PM

## 2014-08-16 NOTE — Patient Instructions (Addendum)
   Begin Diltiazem 30mg  as needed palpitations. - new prescription sent to [facility] today. Continue all other medications.   Your physician has requested that you have an echocardiogram. Echocardiography is a painless test that uses sound waves to create images of your heart. It provides your doctor with information about the size and shape of your heart and how well your heart's chambers and valves are working. This procedure takes approximately one hour. There are no restrictions for this procedure. Your physician has requested that you have an exercise tolerance test. For further information please visit [email or web address]. Please also follow instruction sheet, as given. Office will contact with results via phone or letter.   Follow up in  6 weeks

## 2014-08-16 NOTE — Telephone Encounter (Signed)
Echo set for 5/26 [place] scheduled for 5/31 at [facility]

## 2014-08-20 ENCOUNTER — Encounter: Payer: Self-pay | Admitting: *Deleted

## 2014-08-23 ENCOUNTER — Other Ambulatory Visit (HOSPITAL_COMMUNITY): Payer: BLUE CROSS/BLUE SHIELD

## 2014-08-23 ENCOUNTER — Ambulatory Visit (HOSPITAL_COMMUNITY): Payer: BLUE CROSS/BLUE SHIELD | Attending: Internal Medicine

## 2014-08-23 ENCOUNTER — Other Ambulatory Visit: Payer: Self-pay

## 2014-08-23 DIAGNOSIS — R079 Chest pain, unspecified: Secondary | ICD-10-CM

## 2014-08-24 ENCOUNTER — Telehealth: Payer: Self-pay | Admitting: *Deleted

## 2014-08-24 NOTE — Telephone Encounter (Signed)
Reviewed. Normal TSH.

## 2014-08-24 NOTE — Telephone Encounter (Signed)
Care everywhere investigation for thyroid labs.   Dr. Bronson Ing - please see 02/23/2014 date in labs section.  [facility])

## 2014-08-28 ENCOUNTER — Other Ambulatory Visit (HOSPITAL_COMMUNITY): Payer: BLUE CROSS/BLUE SHIELD

## 2014-08-28 ENCOUNTER — Ambulatory Visit (HOSPITAL_COMMUNITY)
Admission: RE | Admit: 2014-08-28 | Discharge: 2014-08-28 | Disposition: A | Discharge status: Home / Self Care | Payer: BLUE CROSS/BLUE SHIELD | Source: Ambulatory Visit | Attending: Cardiovascular Disease | Admitting: Cardiovascular Disease

## 2014-08-28 DIAGNOSIS — R079 Chest pain, unspecified: Secondary | ICD-10-CM | POA: Diagnosis present

## 2014-08-28 LAB — EXERCISE TOLERANCE TEST
CHL CUP MPHR: 174 {beats}/min
CHL CUP RESTING HR STRESS: 65 {beats}/min
CSEPEW: 10.1 METS
Exercise duration (min): 9 min
Exercise duration (sec): 0 s
Peak HR: 155 {beats}/min
Percent HR: 89 %

## 2014-08-31 ENCOUNTER — Telehealth: Payer: Self-pay | Admitting: *Deleted

## 2014-08-31 NOTE — Telephone Encounter (Signed)
Patient notified of normal stress test & echo per Dr. Bronson Ing.    6 week f/u was scheduled for 09/26/2014 here in [place] with Dr. Bronson Ing.  Offered [facility] or [employer] as she lives in [place], but patient preferred to stay with Dr. Raliegh Ip here in [place].

## 2014-09-25 ENCOUNTER — Other Ambulatory Visit: Payer: Self-pay | Admitting: Obstetrics and Gynecology

## 2014-09-26 ENCOUNTER — Ambulatory Visit (INDEPENDENT_AMBULATORY_CARE_PROVIDER_SITE_OTHER): Payer: BLUE CROSS/BLUE SHIELD | Admitting: Cardiovascular Disease

## 2014-09-26 ENCOUNTER — Encounter: Payer: Self-pay | Admitting: Cardiovascular Disease

## 2014-09-26 VITALS — BP 100/70 | HR 77 | Ht 63.0 in | Wt 169.0 lb

## 2014-09-26 DIAGNOSIS — I471 Supraventricular tachycardia: Secondary | ICD-10-CM

## 2014-09-26 DIAGNOSIS — R079 Chest pain, unspecified: Secondary | ICD-10-CM | POA: Diagnosis not present

## 2014-09-26 DIAGNOSIS — M7989 Other specified soft tissue disorders: Secondary | ICD-10-CM | POA: Diagnosis not present

## 2014-09-26 NOTE — Patient Instructions (Signed)
Continue all current medications. Your physician wants you to follow up in: 6 months.  You will receive a reminder letter in the mail one-two months in advance.  If you don't receive a letter, please call our office to schedule the follow up appointment   

## 2014-09-26 NOTE — Progress Notes (Signed)
[name], female   DOB: 1965/04/10, 49 y.o.   MRN: [id number]      SUBJECTIVE: The patient returns for follow-up after undergoing cardiovascular testing performed for the evaluation of chest pain, PSVT, and feet swelling. She underwent a low risk exercise treadmill stress test as well as echocardiogram which demonstrated LVEF 60-65%. She recently returned from a trip to [place], [place]. She very seldom has palpitations. She occasionally has dorsal feet edema but this is seldom and not very significant. She denies chest pain and shortness of breath. She has questions regarding an increase in the size of her thorax over the past few years.    Review of Systems: As per "subjective", otherwise negative.  Allergies  Allergen Reactions  . Codeine Shortness Of Breath    Stops breathing    Current Outpatient Prescriptions  Medication Sig Dispense Refill  . diltiazem (CARDIZEM) 30 MG tablet Take 1 tablet (30 mg total) by mouth as needed (palpitations). 30 tablet 2  . fluocinonide cream (LIDEX) 1.06 % Apply 1 application topically 2 (two) times daily.    Marland Kitchen triamcinolone cream (KENALOG) 0.1 % Apply 1 application topically 2 (two) times daily.    . valACYclovir (VALTREX) 500 MG tablet Take 500 mg by mouth as needed.      No current facility-administered medications for this visit.    Past Medical History  Diagnosis Date  . Abnormal Pap smear 1988 & 2004    laser surgery '88  . Hx of irritable bowel syndrome   . Heart murmur     asymptomatic  . FH: thyroid disease   . FH: lupus   . FH: hypertension   . FH: diabetes mellitus   . FH: stroke   . FH: rheumatoid arthritis   . H/O: menorrhagia 08/02/2003  . Endometrial polyp 08/02/03  . HPV in female 39  . H/O varicella   . Condylomata acuminata   . H/O candidiasis   . Hx: UTI (urinary tract infection) 11/1995  . Irregular menses   . H/O cervicitis 2002  . Bacterial vaginosis 2005  . H/O dysmenorrhea 2005  . Purulent  vaginitis 02/2004  . History of fatigue 2008  . Hx of ovarian cyst 2008  . Hair loss 2008  . Weight loss 07/2006  . Fibroid 2009  . Vulvar lesion 09/2007  . Anemia 02/2008  . Ecchymosis 2011  . Pelvic pain in female 2012    transient  . H/O vaginal bleeding 04/2010  . Mastodynia, female 08/2010    transient     Past Surgical History  Procedure Laterality Date  . Cesarean section  1995  . Ovarian cyst removal  2001  . Wisdom tooth extraction  1990    History   Social History  . Marital Status: Married    Spouse Name: N/A  . Number of Children: N/A  . Years of Education: N/A   Occupational History  . Not on file.   Social History Main Topics  . Smoking status: Never Smoker   . Smokeless tobacco: Never Used  . Alcohol Use: No  . Drug Use: No  . Sexual Activity: Yes    Birth Control/ Protection: Surgical     Comment: husband with vasectomy    Other Topics Concern  . Not on file   Social History Narrative     Filed Vitals:   09/26/14 1026  BP: 100/70  Pulse: 77  Height: 5\' 3"  (1.6 m)  Weight: 169 lb (76.658 kg)  SpO2: 98%    PHYSICAL EXAM General: NAD HEENT: Normal. Neck: No JVD, no thyromegaly. Lungs: Clear to auscultation bilaterally with normal respiratory effort. CV: Nondisplaced PMI.  Regular rate and rhythm, normal S1/S2, no S3/S4, no murmur. No pretibial or periankle edema.  No carotid bruit.  Normal pedal pulses.  Abdomen: Soft, nontender, no hepatosplenomegaly, no distention.  Neurologic: Alert and oriented x 3.  Psych: Normal affect. Skin: Normal. Musculoskeletal: Normal range of motion, no gross deformities. Extremities: No clubbing or cyanosis.   ECG: Most recent ECG reviewed.      ASSESSMENT AND PLAN: 1. Paroxysmal SVT: Symptomatically stable. Continue diltiazem 30 mg to be used as needed for palpitations. I previously educated her on vagal maneuvers. If they were to increase in frequency or duration, I would then consider daily  pharmacotherapy perhaps with short acting diltiazem. If the arrhythmia is recalcitrant to pharmacotherapy, would then proceed with an EP referral for ablation consideration.   2. Chest pain and feet swelling: Symptomatically stable with normal echocardiogram and exercise treadmill stress test.  3. Sleep apnea: Encouraged to proceed with sleep study.  Dispo: f/u 6 months.   Kate Sable, M.D., F.A.C.C.

## 2015-02-19 ENCOUNTER — Other Ambulatory Visit: Payer: Self-pay | Admitting: Gastroenterology

## 2015-02-19 DIAGNOSIS — R1310 Dysphagia, unspecified: Secondary | ICD-10-CM

## 2015-02-27 ENCOUNTER — Inpatient Hospital Stay: Admission: RE | Admit: 2015-02-27 | Payer: BLUE CROSS/BLUE SHIELD | Source: Ambulatory Visit

## 2015-05-16 DIAGNOSIS — L309 Dermatitis, unspecified: Secondary | ICD-10-CM | POA: Insufficient documentation

## 2015-06-11 ENCOUNTER — Other Ambulatory Visit: Payer: Self-pay | Admitting: Obstetrics and Gynecology

## 2015-06-11 DIAGNOSIS — N6489 Other specified disorders of breast: Secondary | ICD-10-CM

## 2015-06-11 DIAGNOSIS — N6320 Unspecified lump in the left breast, unspecified quadrant: Secondary | ICD-10-CM

## 2015-06-14 ENCOUNTER — Ambulatory Visit
Admission: RE | Admit: 2015-06-14 | Discharge: 2015-06-14 | Disposition: A | Discharge status: Home / Self Care | Payer: BLUE CROSS/BLUE SHIELD | Source: Ambulatory Visit | Attending: Obstetrics and Gynecology | Admitting: Obstetrics and Gynecology

## 2015-06-14 DIAGNOSIS — N6320 Unspecified lump in the left breast, unspecified quadrant: Secondary | ICD-10-CM

## 2016-03-19 ENCOUNTER — Other Ambulatory Visit: Payer: Self-pay | Admitting: Obstetrics and Gynecology

## 2016-03-19 DIAGNOSIS — D252 Subserosal leiomyoma of uterus: Secondary | ICD-10-CM

## 2016-03-19 DIAGNOSIS — D250 Submucous leiomyoma of uterus: Secondary | ICD-10-CM

## 2016-03-19 DIAGNOSIS — D251 Intramural leiomyoma of uterus: Secondary | ICD-10-CM

## 2016-04-07 ENCOUNTER — Ambulatory Visit
Admission: RE | Admit: 2016-04-07 | Discharge: 2016-04-07 | Disposition: A | Discharge status: Home / Self Care | Payer: BLUE CROSS/BLUE SHIELD | Source: Ambulatory Visit | Attending: Obstetrics and Gynecology | Admitting: Obstetrics and Gynecology

## 2016-04-07 DIAGNOSIS — D252 Subserosal leiomyoma of uterus: Secondary | ICD-10-CM

## 2016-04-07 DIAGNOSIS — D250 Submucous leiomyoma of uterus: Secondary | ICD-10-CM

## 2016-04-07 DIAGNOSIS — D251 Intramural leiomyoma of uterus: Secondary | ICD-10-CM

## 2016-04-07 MED ORDER — GADOBENATE DIMEGLUMINE 529 MG/ML IV SOLN
15.0000 mL | Freq: Once | INTRAVENOUS | Status: AC | PRN
  Administered 2016-04-07: 15 mL via INTRAVENOUS

## 2016-04-30 ENCOUNTER — Other Ambulatory Visit: Payer: Self-pay | Admitting: Obstetrics and Gynecology

## 2016-04-30 DIAGNOSIS — Z1231 Encounter for screening mammogram for malignant neoplasm of breast: Secondary | ICD-10-CM

## 2016-05-29 ENCOUNTER — Ambulatory Visit
Admission: RE | Admit: 2016-05-29 | Discharge: 2016-05-29 | Disposition: A | Discharge status: Home / Self Care | Payer: BLUE CROSS/BLUE SHIELD | Source: Ambulatory Visit | Attending: Obstetrics and Gynecology | Admitting: Obstetrics and Gynecology

## 2016-05-29 DIAGNOSIS — Z1231 Encounter for screening mammogram for malignant neoplasm of breast: Secondary | ICD-10-CM

## 2016-06-05 ENCOUNTER — Encounter: Payer: Self-pay | Admitting: Cardiovascular Disease

## 2016-06-15 ENCOUNTER — Telehealth: Payer: Self-pay | Admitting: *Deleted

## 2016-06-15 NOTE — Telephone Encounter (Signed)
Most recent OV, echo, stress test, & EKG faxed at patient's request.

## 2016-06-15 NOTE — Telephone Encounter (Signed)
Patient called requesting her records be sent to [facility] - having uterine biopsy done.    [facility]   Phone:  [phone]    Fax:  [phone]

## 2016-06-18 ENCOUNTER — Other Ambulatory Visit: Payer: Self-pay | Admitting: Obstetrics and Gynecology

## 2016-06-22 ENCOUNTER — Encounter (HOSPITAL_COMMUNITY): Payer: Self-pay | Admitting: Emergency Medicine

## 2016-06-22 ENCOUNTER — Other Ambulatory Visit: Payer: Self-pay

## 2016-06-22 ENCOUNTER — Emergency Department (HOSPITAL_COMMUNITY)
Admission: EM | Admit: 2016-06-22 | Discharge: 2016-06-22 | Disposition: A | Discharge status: Home / Self Care | Payer: BLUE CROSS/BLUE SHIELD | Attending: Emergency Medicine | Admitting: Emergency Medicine

## 2016-06-22 DIAGNOSIS — E119 Type 2 diabetes mellitus without complications: Secondary | ICD-10-CM | POA: Diagnosis not present

## 2016-06-22 DIAGNOSIS — I471 Supraventricular tachycardia: Secondary | ICD-10-CM | POA: Insufficient documentation

## 2016-06-22 DIAGNOSIS — Z8673 Personal history of transient ischemic attack (TIA), and cerebral infarction without residual deficits: Secondary | ICD-10-CM | POA: Insufficient documentation

## 2016-06-22 DIAGNOSIS — R002 Palpitations: Secondary | ICD-10-CM | POA: Diagnosis present

## 2016-06-22 DIAGNOSIS — I10 Essential (primary) hypertension: Secondary | ICD-10-CM | POA: Diagnosis not present

## 2016-06-22 LAB — I-STAT TROPONIN, ED: TROPONIN I, POC: 0 ng/mL (ref 0.00–0.08)

## 2016-06-22 LAB — I-STAT CHEM 8, ED
BUN: 9 mg/dL (ref 6–20)
CHLORIDE: 105 mmol/L (ref 101–111)
CREATININE: 0.8 mg/dL (ref 0.44–1.00)
Calcium, Ion: 1.27 mmol/L (ref 1.15–1.40)
GLUCOSE: 93 mg/dL (ref 65–99)
HCT: 43 % (ref 36.0–46.0)
Hemoglobin: 14.6 g/dL (ref 12.0–15.0)
POTASSIUM: 4 mmol/L (ref 3.5–5.1)
Sodium: 142 mmol/L (ref 135–145)
TCO2: 25 mmol/L (ref 0–100)

## 2016-06-22 LAB — CBC
HEMATOCRIT: 41.1 % (ref 36.0–46.0)
Hemoglobin: 13.6 g/dL (ref 12.0–15.0)
MCH: 25.8 pg — ABNORMAL LOW (ref 26.0–34.0)
MCHC: 33.1 g/dL (ref 30.0–36.0)
MCV: 78 fL (ref 78.0–100.0)
Platelets: 259 10*3/uL (ref 150–400)
RBC: 5.27 MIL/uL — ABNORMAL HIGH (ref 3.87–5.11)
RDW: 14.1 % (ref 11.5–15.5)
WBC: 9.5 10*3/uL (ref 4.0–10.5)

## 2016-06-22 MED ORDER — SODIUM CHLORIDE 0.9 % IV BOLUS (SEPSIS)
500.0000 mL | Freq: Once | INTRAVENOUS | Status: AC
  Administered 2016-06-22: 500 mL via INTRAVENOUS

## 2016-06-22 MED ORDER — DILTIAZEM HCL 30 MG PO TABS: 30.0000 mg | ORAL_TABLET | Freq: Four times a day (QID) | ORAL | 0 refills | Status: DC | PRN

## 2016-06-22 NOTE — Discharge Instructions (Signed)
Your rapid heartbeat, is likely the previously diagnosed condition called supraventricular tachycardia.  We are going to refill your prescription for diltiazem, to use if needed for rapid heartbeats.  It is important to follow up with your cardiologist for further evaluation, and treatment if needed.  In the meantime make sure you are staying well hydrated, eating regularly and getting plenty of sleep.  Return here, if needed, for problems.

## 2016-06-22 NOTE — ED Triage Notes (Signed)
PT reports sudden onset tachycardia at 1430. PT reports palpitations. PT reports lightheadedness and headache with slight SOB. PT denies chest pain. PT HR is 150s during triage. PT reports an episode of SVT three years ago that was stopped with medication,

## 2016-06-22 NOTE — ED Provider Notes (Signed)
[facility] DEPT Provider Note   CSN: [id number] Arrival date & time: 06/22/16  1519     History   Chief Complaint Chief Complaint  Patient presents with  . Tachycardia    HPI Stephanie Trujillo is a 51 y.o. female.  She presents for evaluation of palpitations felt as rapid heart beating, which started today while she was resting.  History similar problem 2016, treated with intravenous medication for conversion, seen in ED, then discharged.  She describes seeing cardiology and getting a prescription for diltiazem, to use as needed, but never did.  She denies recent illnesses including fever, chills, cough, nausea, vomiting, weakness or dizziness.  There are no other known modifying factors.  HPI  Past Medical History:  Diagnosis Date  . Abnormal Pap smear 1988 & 2004   laser surgery '88  . Anemia 02/2008  . Bacterial vaginosis 2005  . Condylomata acuminata   . Ecchymosis 2011  . Endometrial polyp 08/02/03  . FH: diabetes mellitus   . FH: hypertension   . FH: lupus   . FH: rheumatoid arthritis   . FH: stroke   . FH: thyroid disease   . Fibroid 2009  . H/O candidiasis   . H/O cervicitis 2002  . H/O dysmenorrhea 2005  . H/O vaginal bleeding 04/2010  . H/O varicella   . H/O: menorrhagia 08/02/2003  . Hair loss 2008  . Heart murmur    asymptomatic  . History of fatigue 2008  . 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 Hx of irritable bowel syndrome   . Hx of ovarian cyst 2008  . Hx: UTI (urinary tract infection) 11/1995  . Irregular menses   . Mastodynia, female 08/2010   transient   . Pelvic pain in female 2012   transient  . 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 Vulvar lesion 09/2007  . Weight loss 07/2006    Patient Active Problem List   Diagnosis Date Noted  . Paroxysmal SVT (supraventricular tachycardia) ([place]) 08/16/2014  . Single cyst of left breast 04/11/2012  . Dense breasts 03/30/2012  . Pelvic pain 08/27/2011  . Endometriosis 08/27/2011  . 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 Mass of  breast, right 08/27/2011  . Fibroids 08/27/2011    Past Surgical History:  Procedure Laterality Date  . BREAST CYST ASPIRATION    . CESAREAN SECTION  1995  . OVARIAN CYST REMOVAL  2001  . WISDOM TOOTH EXTRACTION  1990    OB History    Gravida Para Term Preterm AB Living   3 3 3     3    SAB TAB Ectopic Multiple Live Births           3       Home Medications    Prior to Admission medications   Medication Sig Start Date End Date Taking? Authorizing Provider  fluocinonide cream (LIDEX) 1.01 % Apply 1 application topically 2 (two) times daily as needed (eczema).    Yes Historical Provider, MD  triamcinolone cream (KENALOG) 0.1 % Apply 1 application topically 2 (two) times daily as needed (itching).    Yes Historical Provider, MD  diltiazem (CARDIZEM) 30 MG tablet Take 1 tablet (30 mg total) by mouth every 6 (six) hours as needed (palpitation). 06/22/16   Daleen Bo, MD    Family History Family History  Problem Relation Age of Onset  . Lupus Mother   . Hypertension Father   . Arrhythmia Father   . Diabetes Paternal Grandmother   . Breast cancer Cousin   .  Breast cancer Cousin     Social History Social History  Substance Use Topics  . Smoking status: Never Smoker  . Smokeless tobacco: Never Used  . Alcohol use Yes     Comment: occassionally     Allergies   Codeine   Review of Systems Review of Systems  All other systems reviewed and are negative.    Physical Exam Updated Vital Signs BP 110/81   Pulse 85   Temp 98.2 F (36.8 C) (Oral)   Resp 16   Ht 5\' 3"  (1.6 m)   Wt 168 lb (76.2 kg)   LMP 06/01/2016   SpO2 100%   BMI 29.76 kg/m   Physical Exam  Constitutional: She is oriented to person, place, and time. She appears well-developed and well-nourished. No distress.  HENT:  Head: Normocephalic and atraumatic.  Eyes: Conjunctivae and EOM are normal. Pupils are equal, round, and reactive to light.  Neck: Normal range of motion and phonation  normal. Neck supple.  Cardiovascular:  As I entered the room the heart rate on the monitor was 150 bpm.  As I was talking to the patient she converted to a sinus tachycardia at 105-109 bpm.  Pulmonary/Chest: Effort normal and breath sounds normal. She exhibits no tenderness.  Abdominal: Soft. She exhibits no distension. There is no tenderness. There is no guarding.  Musculoskeletal: Normal range of motion.  Neurological: She is alert and oriented to person, place, and time. She exhibits normal muscle tone.  Skin: Skin is warm and dry.  Psychiatric: She has a normal mood and affect. Her behavior is normal. Judgment and thought content normal.  Nursing note and vitals reviewed.    ED Treatments / Results  Labs (all labs ordered are listed, but only abnormal results are displayed) Labs Reviewed  CBC - Abnormal; Notable for the following:       Result Value   RBC 5.27 (*)    MCH 25.8 (*)    All other components within normal limits  I-STAT TROPOININ, ED  I-STAT CHEM 8, ED    EKG  EKG Interpretation  Date/Time:  Monday June 22 2016 16:14:04 EDT Ventricular Rate:  111 PR Interval:    QRS Duration: 70 QT Interval:  306 QTC Calculation: 416 R Axis:   62 Text Interpretation:  Sinus tachycardia LAE, consider biatrial enlargement Borderline T abnormalities, anterior leads Since last tracing Rate slower sinus tachycardia Confirmed by Eulis Foster  MD, Vira Agar ([id number]) on 06/22/2016 4:23:36 PM       Radiology No results found.  Procedures Procedures (including critical care time)  Medications Ordered in ED Medications  sodium chloride 0.9 % bolus 500 mL (0 mLs Intravenous Stopped 06/22/16 1700)     Initial Impression / Assessment and Plan / ED Course  I have reviewed the triage vital signs and the nursing notes.  Pertinent labs & imaging results that were available during my care of the patient were reviewed by me and considered in my medical decision making (see chart for  details).     Medications  sodium chloride 0.9 % bolus 500 mL (0 mLs Intravenous Stopped 06/22/16 1700)    Patient Vitals for the past 24 hrs:  BP Temp Temp src Pulse Resp SpO2 Height Weight  06/22/16 1836 110/81 - - 85 16 100 % - -  06/22/16 1745 109/81 - - 92 20 96 % - -  06/22/16 1730 122/83 - - 94 20 100 % - -  06/22/16 1700 (!) 113/95 - - Marland Kitchen)  103 18 99 % - -  06/22/16 1615 127/87 - - (!) 108 16 100 % - -  06/22/16 1600 127/85 - - (!) 103 16 99 % - -  06/22/16 1545 (!) 117/103 - - (!) 149 15 97 % - -  06/22/16 1537 - - - - - - - 168 lb (76.2 kg)  06/22/16 1529 (!) 163/114 98.2 F (36.8 C) Oral (!) 170 18 100 % 5\' 3"  (1.6 m) 168 lb (76.2 kg)    7:07 PM Reevaluation with update and discussion. After initial assessment and treatment, an updated evaluation reveals she remains comfortable, with reassuring heart rate of 85.  Monitor indicates sinus rhythm.  I did discuss with patient and husband, all questions answered. Alphonsine Minium L    Final Clinical Impressions(s) / ED Diagnoses   Final diagnoses:  SVT (supraventricular tachycardia) ([place])   Apparent recurrent SVT, spontaneously resolved.  No clear inciting cause.  Doubt serious bacterial infection metabolic instability or impending vascular collapse.  Nursing Notes Reviewed/ Care Coordinated Applicable Imaging Reviewed Interpretation of Laboratory Data incorporated into ED treatment  The patient appears reasonably screened and/or stabilized for discharge and I doubt any other medical condition or other [facility] requiring further screening, evaluation, or treatment in the ED at this time prior to discharge.  Plan: Home Medications-continue usual medications; Home Treatments-rest, fluids, plenty of sleep; return here if the recommended treatment, does not improve the symptoms; Recommended follow up-PCP, as needed.  Follow-up cardiology 1 week, and as needed    New Prescriptions New Prescriptions   DILTIAZEM (CARDIZEM) 30 MG  TABLET    Take 1 tablet (30 mg total) by mouth every 6 (six) hours as needed (palpitation).     Daleen Bo, MD 06/22/16 Einar Crow

## 2016-06-22 NOTE — ED Notes (Signed)
ED Provider at bedside. 

## 2016-06-23 ENCOUNTER — Telehealth: Payer: Self-pay | Admitting: Internal Medicine

## 2016-06-23 ENCOUNTER — Telehealth: Payer: Self-pay | Admitting: Cardiovascular Disease

## 2016-06-23 NOTE — Telephone Encounter (Signed)
Good Afternoon,  Patient would like to switch from Dr. Bronson Ing at our [facility] office to Dr. Percival Spanish here at [facility].    Thank You, Caryl Ada

## 2016-06-23 NOTE — Telephone Encounter (Signed)
OK with me.

## 2016-07-02 NOTE — Telephone Encounter (Signed)
close

## 2016-07-15 ENCOUNTER — Ambulatory Visit (INDEPENDENT_AMBULATORY_CARE_PROVIDER_SITE_OTHER): Payer: BLUE CROSS/BLUE SHIELD | Admitting: Cardiology

## 2016-07-15 VITALS — BP 124/82 | HR 76 | Ht 62.0 in | Wt 167.0 lb

## 2016-07-15 DIAGNOSIS — I471 Supraventricular tachycardia: Secondary | ICD-10-CM

## 2016-07-15 MED ORDER — DILTIAZEM HCL 30 MG PO TABS: 30.0000 mg | ORAL_TABLET | Freq: Four times a day (QID) | ORAL | 11 refills | Status: DC | PRN

## 2016-07-15 NOTE — Progress Notes (Signed)
PCP: DAY,TEMPLE V, MD  Clinic Note: Chief Complaint  Patient presents with  . Follow-up    New patient for Dr. Ellyn Hack - last visit 2016 with [facility]  . Tachycardia    At ER.  . Edema    Feet in the mornings.    HPI: Stephanie Trujillo is a 51 y.o. female with a PMH below who presents today for Posthospital evaluation for SVT. She apparently had a similar problem back in 2016 and was treated apparently with adenosine in the ER. She was given 9 ounces of SVT was given diltiazem but she never used it because it was as needed. This episode upon evaluation in the ER and resolve spontaneously.  Stephanie Trujillo was last seen on 09/26/2014 by Dr. Bronson Ing -- she had been evaluated with a GXT and echocardiogram. He did prescribe her when necessary diltiazem vagal maneuvers.  Recent Hospitalizations: ER  Visit 3/26 for SVT - by the time she arrived, rate was slowing & broke spontaneously. 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  Studies Reviewed:  ER EKGs  3/26: QRS tachycardia, rate 170 BPM. Consistent with SVT  f/u EKG - sinus tachycardia rate 111 BPM.   2-D Echocardiogram May 2016: Normal LV size and function. EF 60-65%. No R WMA. No valve disease NORMAL STUDY  GXT 07/2014: Exercised 9 min. Peak HR 155 (89% Max Pred). 10.1 METS, No EKG change c/s ischemia. No Sx.   Interval History: [name] presents today pretty much to discuss this new episode that she's had previous been almost 2 years since her last episode. She tried to do some vagal maneuvers such as coughing and bearing down, but was unsuccessful -- she actually did not have a active prescription for diltiazem, therefore we'll try that.. Interestingly though, for the last 9-10 days is also noticing some random episodes of slow heartbeat. She is also noted some swelling in her feet she notices off and on.  She has been having occasional "fluttering sensations, but nothing terribly significant. She  acknowledges that she has not been exercising as much as she should be seen in April to weight, but has not done anything to trigger an episode. She doesn't drink a lot of coffee or sodas, but really doesn't drink, water either.   Otherwise, she denies any chest pain or dyspnea with rest or exertion. No heart failure symptoms of PND, orthopnea or edema. Besides this one episode she really hasn't noticed any rapid palpitations, just some slow heartbeats. No lightheadedness, dizziness, wooziness or syncope/near-syncope. By the time his episode had lasted long enough for her to get to the hospital she was starting to get a little lightheaded  No TIA/amaurosis fugax symptoms.  No claudication.  ROS: A comprehensive was performed. Review of Systems  Constitutional: Negative for malaise/fatigue (Just immediately following her tachycardia episode she felt very tired and worn out.).  HENT: Negative for congestion.   Respiratory: Negative for shortness of breath and wheezing.   Cardiovascular: Positive for leg swelling.  Gastrointestinal: Positive for heartburn. Negative for abdominal pain, blood in stool and melena.  Genitourinary: Negative for hematuria.  Musculoskeletal: Negative for joint pain.  Neurological: Negative for dizziness (Only when her heart was going fast) and focal weakness.  Endo/Heme/Allergies: Positive for environmental allergies.  Psychiatric/Behavioral: The patient is not nervous/anxious.   All other systems reviewed and are negative.   Past Medical History:  Diagnosis Date  . Abnormal Pap smear 1988 & 2004  laser surgery '88  . Anemia 02/2008  . Bacterial vaginosis 2005  . Condylomata acuminata   . Ecchymosis 2011  . Endometrial polyp 08/02/03  . FH: diabetes mellitus   . FH: hypertension   . FH: lupus   . FH: rheumatoid arthritis   . FH: stroke   . FH: thyroid disease   . Fibroid 2009  . H/O candidiasis   . H/O cervicitis 2002  . H/O dysmenorrhea 2005  . H/O  vaginal bleeding 04/2010  . H/O varicella   . H/O: menorrhagia 08/02/2003  . Hair loss 2008  . 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 Hx of irritable bowel syndrome   . Hx of ovarian cyst 2008  . Irregular menses   . Mastodynia, female 08/2010   transient   . Pelvic pain in female 2012   transient  . 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 Vulvar lesion 09/2007  . Weight loss 07/2006    Past Surgical History:  Procedure Laterality Date  . BREAST CYST ASPIRATION    . CESAREAN SECTION  1995  . OVARIAN CYST REMOVAL  2001  . WISDOM TOOTH EXTRACTION  1990    Current Meds  Medication Sig  . diltiazem (CARDIZEM) 30 MG tablet Take 1 tablet (30 mg total) by mouth every 6 (six) hours as needed (palpitation).  . fluocinonide cream (LIDEX) 4.17 % Apply 1 application topically 2 (two) times daily as needed (eczema).   . triamcinolone cream (KENALOG) 0.1 % Apply 1 application topically 2 (two) times daily as needed (itching).   . [DISCONTINUED] diltiazem (CARDIZEM) 30 MG tablet Take 1 tablet (30 mg total) by mouth every 6 (six) hours as needed (palpitation).    Allergies  Allergen Reactions  . Codeine Shortness Of Breath    Stops breathing    Social History   Social History  . Marital status: Married    Spouse name: N/A  . Number of children: N/A  . Years of education: N/A   Social History Main Topics  . Smoking status: Never Smoker  . Smokeless tobacco: Never Used  . Alcohol use Yes     Comment: occassionally  . Drug use: No  . Sexual activity: Yes    Birth control/ protection: Surgical     Comment: husband with vasectomy    Other Topics Concern  . None   Social History Narrative  . None    family history includes Arrhythmia in her father; Breast cancer in her cousin and cousin; Diabetes in her paternal grandmother; Hypertension in her father; Lupus in her mother.  Wt Readings from Last 3 Encounters:  07/15/16 167 lb (75.8 kg)  06/22/16 168 lb (76.2 kg)  09/26/14 169 lb (76.7 kg)     PHYSICAL EXAM BP 124/82   Pulse 76   Ht 5\' 2"  (1.575 m)   Wt 167 lb (75.8 kg)   BMI 30.54 kg/m  General appearance: alert, cooperative, appears stated age, no distress and Moderately obese. She is well-nourished, well-groomed. 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[employer]) Neck: no adenopathy, no carotid bruit and no JVD Lungs: clear to auscultation bilaterally, normal percussion bilaterally and non-labored Heart: regular rate and rhythm, S1 and S2 normal, no murmur, click, rub or gallop ; nondisplaced PMI Abdomen: soft, non-tender; bowel sounds normal; no masses,  no organomegaly; no HJR Extremities: extremities normal, atraumatic, no cyanosis, and edema trivial Pulses: 2+ and symmetric;  Skin: mobility and turgor normal, no edema, no evidence of bleeding or bruising and  no lesions noted Neurologic: Mental status: Alert,&  Oriented x3 , thought content appropriate; normal mood and affect; Cranial nerves: normal (II-XII grossly intact)    Adult ECG Report  Rate: 76 ;  Rhythm: normal sinus rhythm and Normal axis, intervals and durations. Normal EKG.;   Narrative Interpretation: Normal EKG when compared to SVT in the hospital.   Other studies Reviewed: Additional studies/ records that were reviewed today include:  Recent Labs:     Chemistry      Component Value Date/Time   NA 142 06/22/2016 1734   K 4.0 06/22/2016 1734   CL 105 06/22/2016 1734   CO2 24 08/14/2014 1112   BUN 9 06/22/2016 1734   CREATININE 0.80 06/22/2016 1734      Component Value Date/Time   CALCIUM 8.8 (L) 08/14/2014 1112       ASSESSMENT / PLAN: Problem List Items Addressed This Visit    Paroxysmal SVT (supraventricular tachycardia) (HCC) - Primary (Chronic)    2 episodes in just under 2 years. Each one did not last very long. I talked about various options vagal maneuvers and added squatting and cold water to her list. We also did provide a new prescription for when necessary diltiazem and  convinced her that it would be safe for her to take. Apparently some of the ER and told her that diltiazem to slow her heart rate down enough where we had the opposite problem. I convinced her that that is not the case. For now I think the best option is to simply monitor her expectantly and only act if symptoms recur. She has not had any significant symptoms from an episode, we did, the importance of following up beside the road if she is driving since. Told that if her heart rate does not respond to vagal maneuvers within 5 minutes, she should take her when necessary diltiazem 30 mg. If that works, she should probably take diltiazem couple times a day for the next couple days noted. Avoiding triggers such as caffeine, alcohol, sugary foods and dehydration.      Relevant Medications   diltiazem (CARDIZEM) 30 MG tablet   Other Relevant Orders   EKG 12-Lead (Completed)      Current medicines are reviewed at length with the patient today. (+/- concerns) n/a The following changes have been made: n/a  Patient Instructions  MEDICATION REFILLED WITH REFILLS - DILTIAZEM     AS DISCUSSED MAY USE  VAGAL MANEUVERS- FAST HEART RATE EPISODE  VAGAL MANEUVERS SUGGESTION: - COUGHING - BEARING DOWN - PLACE ICE COLD TOWEL TO FACE,AND/ OR DRINK ICE COLD WATER   IF EPISODES DOES NOT SUBSIDE  IN 5 MINUTES OR SO, THAT IS WHEN YOU TAKE THE DILTIAZEM - AS NEEDED MEDICATION   Your physician wants you to follow-up in [place] DR HARDING. You will receive a reminder letter in the mail two months in advance. If you don't receive a letter, please call our office to schedule the follow-up appointment.  If you need a refill on your cardiac medications before your next appointment, please call your pharmacy.    Studies Ordered:   Orders Placed This Encounter  Procedures  . EKG 12-Lead      Glenetta Hew, M.D., M.S. Interventional Cardiologist   Pager # [phone] Phone #  [phone] [address] [place], [place] [address]

## 2016-07-15 NOTE — Patient Instructions (Signed)
MEDICATION REFILLED WITH REFILLS - DILTIAZEM     AS DISCUSSED MAY USE  VAGAL MANEUVERS- FAST HEART RATE EPISODE  VAGAL MANEUVERS SUGGESTION: - COUGHING - BEARING DOWN - PLACE ICE COLD TOWEL TO FACE,AND/ OR DRINK ICE COLD WATER   IF EPISODES DOES NOT SUBSIDE  IN 5 MINUTES OR SO, THAT IS WHEN YOU TAKE THE DILTIAZEM - AS NEEDED MEDICATION   Your physician wants you to follow-up in 12 MONTHS WITH DR HARDING. You will receive a reminder letter in the mail two months in advance. If you don't receive a letter, please call our office to schedule the follow-up appointment.  If you need a refill on your cardiac medications before your next appointment, please call your pharmacy.

## 2016-07-16 ENCOUNTER — Encounter: Payer: Self-pay | Admitting: Cardiology

## 2016-07-16 NOTE — Assessment & Plan Note (Signed)
2 episodes in just under 2 years. Each one did not last very long. I talked about various options vagal maneuvers and added squatting and cold water to her list. We also did provide a new prescription for when necessary diltiazem and convinced her that it would be safe for her to take. Apparently some of the ER and told her that diltiazem to slow her heart rate down enough where we had the opposite problem. I convinced her that that is not the case. For now I think the best option is to simply monitor her expectantly and only act if symptoms recur. She has not had any significant symptoms from an episode, we did, the importance of following up beside the road if she is driving since. Told that if her heart rate does not respond to vagal maneuvers within 5 minutes, she should take her when necessary diltiazem 30 mg. If that works, she should probably take diltiazem couple times a day for the next couple days noted. Avoiding triggers such as caffeine, alcohol, sugary foods and dehydration.

## 2017-04-22 ENCOUNTER — Telehealth: Payer: Self-pay | Admitting: Cardiology

## 2017-04-22 NOTE — Telephone Encounter (Signed)
OK with me.

## 2017-04-22 NOTE — Telephone Encounter (Signed)
Fine with me  DH 

## 2017-04-22 NOTE — Telephone Encounter (Signed)
New message    Patient requesting to only see Dr Oval Linsey. Patient last saw Dr Ellyn Hack. Patient states she is more comfortable and prefers to see female physician. Patient states her PCP, Dr Leo Grosser also gave recommendation. Please advise

## 2017-04-23 ENCOUNTER — Telehealth: Payer: Self-pay | Admitting: Cardiovascular Disease

## 2017-04-23 NOTE — Telephone Encounter (Signed)
Closed Encounter  °

## 2017-04-23 NOTE — Telephone Encounter (Signed)
Spoke with patient and advised both doctors had approved her changing physicians. Per patient she had been referred secondary to issues with her blood pressure. Advised patient would send to schedulers and they would be in touch with appointment.

## 2017-04-26 ENCOUNTER — Ambulatory Visit: Payer: BLUE CROSS/BLUE SHIELD | Admitting: Cardiology

## 2017-04-28 ENCOUNTER — Encounter: Payer: Self-pay | Admitting: Cardiovascular Disease

## 2017-04-29 ENCOUNTER — Encounter: Payer: Self-pay | Admitting: Cardiovascular Disease

## 2017-04-29 ENCOUNTER — Ambulatory Visit: Payer: BLUE CROSS/BLUE SHIELD | Admitting: Cardiovascular Disease

## 2017-04-29 ENCOUNTER — Encounter (INDEPENDENT_AMBULATORY_CARE_PROVIDER_SITE_OTHER): Payer: Self-pay

## 2017-04-29 VITALS — BP 115/72 | HR 69 | Ht 63.0 in | Wt 161.0 lb

## 2017-04-29 DIAGNOSIS — I471 Supraventricular tachycardia: Secondary | ICD-10-CM | POA: Diagnosis not present

## 2017-04-29 DIAGNOSIS — R030 Elevated blood-pressure reading, without diagnosis of hypertension: Secondary | ICD-10-CM | POA: Diagnosis not present

## 2017-04-29 DIAGNOSIS — E7800 Pure hypercholesterolemia, unspecified: Secondary | ICD-10-CM | POA: Diagnosis not present

## 2017-04-29 NOTE — Patient Instructions (Signed)
Medication Instructions:  Your physician recommends that you continue on your current medications as directed. Please refer to the Current Medication list given to you today.  Labwork: NONE  Testing/Procedures: NONE  Follow-Up: Your physician recommends that you schedule a follow-up appointment in: 2 MONTH OV  Any Other Special Instructions Will Be Listed Below (If Applicable). MONITOR YOUR BLOOD PRESSURE AT HOME TWICE A DAY AND BRING WITH YOU TO YOUR FOLLOW UP   If you need a refill on your cardiac medications before your next appointment, please call your pharmacy.

## 2017-04-29 NOTE — Progress Notes (Signed)
Cardiology Office Note   Date:  04/29/2017   ID:  Stephanie Trujillo, DOB [birth year], MRN [id number]  PCP:  Day, Stephanie Krauss, MD  Cardiologist:   Skeet Latch, MD   No chief complaint on file.   History of Present Illness: Stephanie Trujillo is a 52 y.o. female with SVT and uterine fibroids who presents for follow-up.  She previously saw Dr. Ellyn Trujillo after an emergency room visit for SVT.  She had an episode in 2016 that was treated with adenosine.  She was instructed to start diltiazem but never used it because it was as needed.  At that time she saw Dr. Bronson Trujillo and had an echo that revealed LVEF 60-65% with normal diastolic function.  She was seen in the hospital 06/22/16 with SVT.  By the time she arrived her heart rate was slowing and it spontaneously broke.  She followed up with Dr. Ellyn Trujillo and had not had any recurrent episodes.  She had occasional episodes of fluttering.  She was given another prescription for diltiazem as needed. She has not needed to take this.  She has occasional heart fluttering and feels as though she is about to go into SVT.  However she has been able to control it with vagal maneuvers.  She also notes occasional heart palpitations that last for a second or 2.  She has episodes of lightheadedness and dizziness but not necessarily at the same time as when she is having palpitations.  [name] is a patient of Dr. Kendall Trujillo.  In visiting with Dr. Leo Trujillo and other physicians she is noted that her blood pressure is starting to increase.  It is been in the 140s over 80s several times.  Her baseline is in the 110s.  She has not been monitoring her blood pressure at home.  She denies lower extremity edema, orthopnea, or PND.  She exercises on the elliptical for 1 hour daily and feels good with exercise.  She does report headache that occurs approximately once per month and notes that her vision is declining.  She is been following up with her ophthalmologist on this.   She has occasional episodes of chest pressure that occur at rest.  She is unsure how long they last but they are not associated with nausea, diaphoresis, or shortness of breath.     Past Medical History:  Diagnosis Date  . Abnormal Pap smear 1988 & 2004   laser surgery '88  . Anemia 02/2008  . Bacterial vaginosis 2005  . Condylomata acuminata   . Ecchymosis 2011  . Endometrial polyp 08/02/03  . FH: diabetes mellitus   . FH: hypertension   . FH: lupus   . FH: rheumatoid arthritis   . FH: stroke   . FH: thyroid disease   . Fibroid 2009  . H/O candidiasis   . H/O cervicitis 2002  . H/O dysmenorrhea 2005  . H/O vaginal bleeding 04/2010  . H/O varicella   . H/O: menorrhagia 08/02/2003  . Hair loss 2008  . HPV in female 73  . Hx of irritable bowel syndrome   . Hx of ovarian cyst 2008  . Irregular menses   . Mastodynia, female 08/2010   transient   . Pelvic pain in female 2012   transient  . 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 Vulvar lesion 09/2007  . Weight loss 07/2006    Past Surgical History:  Procedure Laterality Date  . BREAST CYST ASPIRATION    . CESAREAN SECTION  1995  .  OVARIAN CYST REMOVAL  2001  . WISDOM TOOTH EXTRACTION  1990     Current Outpatient Medications  Medication Sig Dispense Refill  . diltiazem (CARDIZEM) 30 MG tablet Take 1 tablet (30 mg total) by mouth every 6 (six) hours as needed (palpitation). 20 tablet 11  . fluocinonide cream (LIDEX) 8.65 % Apply 1 application topically 2 (two) times daily as needed (eczema).     . triamcinolone cream (KENALOG) 0.1 % Apply 1 application topically 2 (two) times daily as needed (itching).     . valACYclovir (VALTREX) 1000 MG tablet Take 1 tablet by mouth daily.     No current facility-administered medications for this visit.     Allergies:   Codeine    Social History:  The patient  reports that  has never smoked. she has never used smokeless tobacco. She reports that she drinks alcohol. She reports that she does  not use drugs.   Family History:  The patient's family history includes Arrhythmia in her father; Breast cancer in her cousin and cousin; Diabetes in her paternal grandmother; Hypertension in her father; Lupus in her mother; Rheum arthritis in her maternal aunt and sister.    ROS:  Please see the history of present illness.   Otherwise, review of systems are positive for none.   All other systems are reviewed and negative.    PHYSICAL EXAM: VS:  BP 115/72   Pulse 69   Ht 5\' 3"  (1.6 m)   Wt 161 lb (73 kg)   BMI 28.52 kg/m  , BMI Body mass index is 28.52 kg/m. GENERAL:  Well appearing HEENT:  Pupils equal round and reactive, fundi not visualized, oral mucosa unremarkable NECK:  No jugular venous distention, waveform within normal limits, carotid upstroke brisk and symmetric, no bruits LUNGS:  Clear to auscultation bilaterally HEART:  RRR.  PMI not displaced or sustained,S1 and S2 within normal limits, no S3, no S4, no clicks, no rubs, no murmurs ABD:  Flat, positive bowel sounds normal in frequency in pitch, no bruits, no rebound, no guarding, no midline pulsatile mass, no hepatomegaly, no splenomegaly EXT:  2 plus pulses throughout, no edema, no cyanosis no clubbing SKIN:  No rashes no nodules NEURO:  Cranial nerves II through XII grossly intact, motor grossly intact throughout PSYCH:  Cognitively intact, oriented to person place and time    EKG:  EKG is ordered today. The ekg ordered today demonstrates sinus rhythm.  Rate 69 bpm.    Echo 08/23/14: Study Conclusions  - Left ventricle: The cavity size was normal. Wall thickness was   normal. Systolic function was normal. The estimated ejection   fraction was in the range of 60% to 65%. Wall motion was normal;   there were no regional wall motion abnormalities. Left   ventricular diastolic function parameters were normal. - Left atrium: The atrium was normal in size.  Impressions:  - Normal study.  ETT 08/28/14:  Normal  Recent Labs: 06/22/2016: BUN 9; Creatinine, Ser 0.80; Hemoglobin 14.6; Platelets 259; Potassium 4.0; Sodium 142   06/18/16: Total cholesterol 208, triglycerides 89, HDL 60, LDL 121  Lipid Panel No results found for: CHOL, TRIG, HDL, CHOLHDL, VLDL, LDLCALC, LDLDIRECT    Wt Readings from Last 3 Encounters:  04/29/17 161 lb (73 kg)  07/15/16 167 lb (75.8 kg)  06/22/16 168 lb (76.2 kg)      ASSESSMENT AND PLAN:  # SVT: Well-controlled.  Continue prn diltiazem.  # Elevated blood pressure: Ms. Riches's blood pressure  has been elevated at several appointments.  Today her blood pressure is well-controlled.  She has excellent exercise and eating habits.  I have asked her to track her blood pressure twice a day and bring the results to her follow-up appointment.  For now we will not start any antihypertensives.  # Hyperlipidemia: LDL was 121.  However her ASCVD 10-year risk is 1%.  Therefore we will not start aspirin or statin.  She was encouraged to continue exercising regularly and to limit her use of fried foods, fatty foods, and cheeses.  She is Artie doing excellent with this.  Current medicines are reviewed at length with the patient today.  The patient does not have concerns regarding medicines.  The following changes have been made:  no change  Labs/ tests ordered today include:  No orders of the defined types were placed in this encounter.    Disposition:   FU with Prudencio Velazco C. Oval Linsey, MD, [facility] in 2 months.     This note was written with the assistance of speech recognition software.  Please excuse any transcriptional errors.  Signed, Iram Lundberg C. Oval Linsey, MD, [facility]  04/29/2017 9:48 AM     [facility]

## 2017-06-14 ENCOUNTER — Ambulatory Visit: Payer: BLUE CROSS/BLUE SHIELD | Admitting: Cardiovascular Disease

## 2017-06-21 ENCOUNTER — Telehealth: Payer: Self-pay | Admitting: Cardiovascular Disease

## 2017-06-21 NOTE — Telephone Encounter (Signed)
Received incoming records from [facility] for upcoming appointment on 08/05/17 @ 10am with Dr. Oval Linsey. Records given to [facility] in Medical Records. 06/21/17 ab

## 2017-06-28 ENCOUNTER — Other Ambulatory Visit: Payer: Self-pay | Admitting: Obstetrics and Gynecology

## 2017-06-30 ENCOUNTER — Ambulatory Visit: Payer: BLUE CROSS/BLUE SHIELD | Admitting: Cardiovascular Disease

## 2017-07-12 DIAGNOSIS — N950 Postmenopausal bleeding: Secondary | ICD-10-CM | POA: Insufficient documentation

## 2017-08-05 ENCOUNTER — Ambulatory Visit: Payer: BLUE CROSS/BLUE SHIELD | Admitting: Cardiovascular Disease

## 2017-08-17 ENCOUNTER — Other Ambulatory Visit: Payer: Self-pay | Admitting: Obstetrics and Gynecology

## 2017-08-27 ENCOUNTER — Encounter (HOSPITAL_COMMUNITY): Payer: Self-pay | Admitting: *Deleted

## 2017-08-30 ENCOUNTER — Encounter (HOSPITAL_COMMUNITY): Payer: Self-pay | Admitting: *Deleted

## 2017-08-30 ENCOUNTER — Other Ambulatory Visit: Payer: Self-pay

## 2017-09-08 NOTE — H&P (Signed)
Stephanie Trujillo is a 52 y.o.  female,   P: 3-0-0-3 who presents for hysteroscopy, dilatation and curettage because of heavy and irregular bleeding,  in spite of  an  endometrial ablation in 2010.  For the past 1.5 years the patient gives a history of her menses  occurring randomly, throughout the year,  during which time  she will saturate her pads  "frequently"  on the first day then have a more reasonable flow (changing pad 4 times a day) for the remaining 4 days of her period.   She admits to mild cramping that does not require analgesia and  urinary hesitancy she  attributes  to her fibroids.   Another discomfort is   occasional lower back pain but denies dysuria, dyspareunia or constipation. The patient had a negative gonorrhea and chlamydia test in March 2019 and her FSH/Estradiol serum levels at that time,  were in the menopausal range. A pelvic ultrasound in February 2019 revealed: an anteverted uterus measuring 7.52 x 8.91 x 9.86 cm, endometrium-7.11 mm and an anterior intramural fibroid-9.31 cm; right ovary-2.53 cm and left ovary-2.71 cm.  A review of both medical and surgical management options were given to the patient however she has chosen to proceed with a  hysteroscopy, dilatation and curettage for evaluation and management of her bleeding.  Past Medical History  OB History: G:3;  P: 3-0-0-3;  C-section 1995; SVB 1998 and 2000  GYN History: menarche: 52 YO    LMP : 03/25/2017   Contraception: vasectomy;   History of HSV-2 and   abnormal PAP smear (1988) that was treated with cryotherapy.   Last PAP : 2019-normal (at [facility])  Medical History: Vitamin D Deficiency, Anemia, Shingles, Menstrual Migraine, Endometriosis, Uterine Fibroids, Ventral Hernia and Eczema.  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 Codeine Shortness Of Breath and Swelling    Stops breathing Stops breathing Throat swells Stops breathing Throat swells Stops breathing Throat swells    Per patient,  she is able to take Vicodin and Percocet   Denies sensitivity to peanuts, shellfish, soy, latex or adhesives.   ROS: Admits to glasses(readers), swelling of throat if something "goes down the wrong way" (causes breathing difficulty when this happens), occasional constipation, urinary hesitancy,  occasional nausea, flatulence and lower back pain with certain activities;  Denies headache, vision changes, nasal congestion, dysphagia, tinnitus, dizziness, hoarseness, cough,  chest pain, shortness of breath,  diarrhea,  urinary frequency, urgency  dysuria, hematuria, vaginitis symptoms, pelvic pain, swelling of joints,easy bruising,   arthralgias, skin  unexplained weight loss and except as is mentioned in the history of present illness, patient's review of systems is otherwise negative.     Physical Exam  Bp: 112/70  P: 68 bom  R: 16  Temperature: 98.4 degree F orally  Weight: 155 lbs.  Height: 5' 2.5 "  BMI: 27.9  O2Sat. 99% (room air)  Neck: supple without masses or thyromegaly Lungs: clear to auscultation Heart: regular rate and rhythm Abdomen: soft, non-tender and no  organomegaly Pelvic:EGBUS- wnl; vagina-normal rugae; uterus-upper limits of normal size and irregular, cervix without lesions or motion tenderness; adnexae-no tenderness or masses Extremities:  no clubbing, cyanosis or edema   Assesment: Abnormal Uterine Bleeding                      S/P Endometrial Ablation                      Endometriosis                      Uterine Fibroids   Disposition: Reviewed with the patient the indications for her procedure, along with the risks of surgery to include, but not limited to: reaction to anesthesia, damage to adjacent organs, infection and excessive bleeding. The patient verbalized understanding of these risks and has consented to proceed with a Hysteroscopy, Dilatation and Curettage and Possible Resection of Intrauterine Adhesions  at [facility] on September 17, 2017.  CSN# [id number]   Shivon Hackel J. Florene Glen, PA-C  for Dr. Seymour Bars. Haygood

## 2017-09-16 NOTE — Anesthesia Preprocedure Evaluation (Signed)
Anesthesia Evaluation  Patient identified by MRN, date of birth, ID band Patient awake    Reviewed: Allergy & Precautions, NPO status , Patient's Chart, lab work & pertinent test results  Airway Mallampati: II  TM Distance: >3 FB Neck ROM: Full    Dental no notable dental hx.    Pulmonary neg pulmonary ROS,    Pulmonary exam normal breath sounds clear to auscultation       Cardiovascular negative cardio ROS Normal cardiovascular exam Rhythm:Regular Rate:Normal     Neuro/Psych negative neurological ROS  negative psych ROS   GI/Hepatic negative GI ROS, Neg liver ROS,   Endo/Other  negative endocrine ROS  Renal/GU negative Renal ROS     Musculoskeletal negative musculoskeletal ROS (+)   Abdominal   Peds  Hematology  (+) anemia ,   Anesthesia Other Findings   Reproductive/Obstetrics negative OB ROS                             Anesthesia Physical Anesthesia Plan  ASA: II  Anesthesia Plan: General   Post-op Pain Management:    Induction: Intravenous  PONV Risk Score and Plan: 4 or greater and Ondansetron, Dexamethasone, Midazolam, Scopolamine patch - Pre-op and Treatment may vary due to age or medical condition  Airway Management Planned: LMA  Additional Equipment:   Intra-op Plan:   Post-operative Plan: Extubation in OR  Informed Consent: I have reviewed the patients History and Physical, chart, labs and discussed the procedure including the risks, benefits and alternatives for the proposed anesthesia with the patient or authorized representative who has indicated his/her understanding and acceptance.   Dental advisory given  Plan Discussed with: CRNA  Anesthesia Plan Comments:         Anesthesia Quick Evaluation

## 2017-09-17 ENCOUNTER — Ambulatory Visit (HOSPITAL_COMMUNITY): Payer: BLUE CROSS/BLUE SHIELD | Admitting: Anesthesiology

## 2017-09-17 ENCOUNTER — Encounter (HOSPITAL_COMMUNITY): Payer: Self-pay

## 2017-09-17 ENCOUNTER — Other Ambulatory Visit: Payer: Self-pay

## 2017-09-17 ENCOUNTER — Encounter (HOSPITAL_COMMUNITY)
Admission: RE | Disposition: A | Discharge status: Home / Self Care | Payer: Self-pay | Source: Ambulatory Visit | Attending: Obstetrics and Gynecology

## 2017-09-17 ENCOUNTER — Ambulatory Visit (HOSPITAL_COMMUNITY)
Admission: RE | Admit: 2017-09-17 | Discharge: 2017-09-17 | Disposition: A | Discharge status: Home / Self Care | Payer: BLUE CROSS/BLUE SHIELD | Source: Ambulatory Visit | Attending: Obstetrics and Gynecology | Admitting: Obstetrics and Gynecology

## 2017-09-17 DIAGNOSIS — Z79899 Other long term (current) drug therapy: Secondary | ICD-10-CM | POA: Insufficient documentation

## 2017-09-17 DIAGNOSIS — Z885 Allergy status to narcotic agent status: Secondary | ICD-10-CM | POA: Insufficient documentation

## 2017-09-17 DIAGNOSIS — D251 Intramural leiomyoma of uterus: Secondary | ICD-10-CM | POA: Insufficient documentation

## 2017-09-17 DIAGNOSIS — N939 Abnormal uterine and vaginal bleeding, unspecified: Secondary | ICD-10-CM | POA: Diagnosis present

## 2017-09-17 DIAGNOSIS — E559 Vitamin D deficiency, unspecified: Secondary | ICD-10-CM | POA: Insufficient documentation

## 2017-09-17 DIAGNOSIS — D219 Benign neoplasm of connective and other soft tissue, unspecified: Secondary | ICD-10-CM | POA: Diagnosis present

## 2017-09-17 DIAGNOSIS — N809 Endometriosis, unspecified: Secondary | ICD-10-CM | POA: Insufficient documentation

## 2017-09-17 HISTORY — PX: DILATATION & CURRETTAGE/HYSTEROSCOPY WITH RESECTOCOPE: SHX5572

## 2017-09-17 HISTORY — DX: Supraventricular tachycardia: I47.1

## 2017-09-17 HISTORY — DX: Supraventricular tachycardia, unspecified: I47.10

## 2017-09-17 LAB — CBC
HCT: 40 % (ref 36.0–46.0)
HEMOGLOBIN: 13 g/dL (ref 12.0–15.0)
MCH: 26.2 pg (ref 26.0–34.0)
MCHC: 32.5 g/dL (ref 30.0–36.0)
MCV: 80.6 fL (ref 78.0–100.0)
Platelets: 288 10*3/uL (ref 150–400)
RBC: 4.96 MIL/uL (ref 3.87–5.11)
RDW: 14 % (ref 11.5–15.5)
WBC: 7 10*3/uL (ref 4.0–10.5)

## 2017-09-17 SURGERY — DILATATION & CURETTAGE/HYSTEROSCOPY WITH RESECTOCOPE
Anesthesia: General

## 2017-09-17 MED ORDER — PROPOFOL 10 MG/ML IV BOLUS
INTRAVENOUS | Status: DC | PRN
  Administered 2017-09-17: 200 mg via INTRAVENOUS

## 2017-09-17 MED ORDER — KETOROLAC TROMETHAMINE 30 MG/ML IJ SOLN
INTRAMUSCULAR | Status: AC
  Filled 2017-09-17: qty 2

## 2017-09-17 MED ORDER — SCOPOLAMINE 1 MG/3DAYS TD PT72
MEDICATED_PATCH | TRANSDERMAL | Status: AC
  Administered 2017-09-17: 1.5 mg via TRANSDERMAL
  Filled 2017-09-17: qty 1

## 2017-09-17 MED ORDER — LIDOCAINE HCL 2 % IJ SOLN
INTRAMUSCULAR | Status: DC | PRN
  Administered 2017-09-17: 10 mL

## 2017-09-17 MED ORDER — LIDOCAINE 2% (20 MG/ML) 5 ML SYRINGE
INTRAMUSCULAR | Status: DC | PRN
  Administered 2017-09-17: 100 mg via INTRAVENOUS

## 2017-09-17 MED ORDER — HYDROMORPHONE HCL 1 MG/ML IJ SOLN: 0.2500 mg | INTRAMUSCULAR | Status: DC | PRN

## 2017-09-17 MED ORDER — SODIUM CHLORIDE 0.9 % IJ SOLN
INTRAMUSCULAR | Status: AC
  Filled 2017-09-17: qty 10

## 2017-09-17 MED ORDER — SCOPOLAMINE 1 MG/3DAYS TD PT72
1.0000 | MEDICATED_PATCH | Freq: Once | TRANSDERMAL | Status: DC
  Administered 2017-09-17: 1.5 mg via TRANSDERMAL

## 2017-09-17 MED ORDER — PROMETHAZINE HCL 25 MG/ML IJ SOLN: 6.2500 mg | INTRAMUSCULAR | Status: DC | PRN

## 2017-09-17 MED ORDER — MIDAZOLAM HCL 5 MG/5ML IJ SOLN
INTRAMUSCULAR | Status: DC | PRN
  Administered 2017-09-17: 2 mg via INTRAVENOUS

## 2017-09-17 MED ORDER — KETOROLAC TROMETHAMINE 30 MG/ML IJ SOLN: 30.0000 mg | Freq: Once | INTRAMUSCULAR | Status: DC | PRN

## 2017-09-17 MED ORDER — OXYCODONE HCL 5 MG/5ML PO SOLN: 5.0000 mg | Freq: Once | ORAL | Status: DC | PRN

## 2017-09-17 MED ORDER — PHENYLEPHRINE 40 MCG/ML (10ML) SYRINGE FOR IV PUSH (FOR BLOOD PRESSURE SUPPORT)
PREFILLED_SYRINGE | INTRAVENOUS | Status: AC
  Filled 2017-09-17: qty 10

## 2017-09-17 MED ORDER — KETOROLAC TROMETHAMINE 30 MG/ML IJ SOLN
INTRAMUSCULAR | Status: DC | PRN
  Administered 2017-09-17: 30 mg via INTRAMUSCULAR
  Administered 2017-09-17: 30 mg via INTRAVENOUS

## 2017-09-17 MED ORDER — DEXAMETHASONE SODIUM PHOSPHATE 10 MG/ML IJ SOLN
INTRAMUSCULAR | Status: DC | PRN
  Administered 2017-09-17: 10 mg via INTRAVENOUS

## 2017-09-17 MED ORDER — DEXAMETHASONE SODIUM PHOSPHATE 10 MG/ML IJ SOLN
INTRAMUSCULAR | Status: AC
  Filled 2017-09-17: qty 1

## 2017-09-17 MED ORDER — PHENYLEPHRINE 40 MCG/ML (10ML) SYRINGE FOR IV PUSH (FOR BLOOD PRESSURE SUPPORT)
PREFILLED_SYRINGE | INTRAVENOUS | Status: DC | PRN
  Administered 2017-09-17 (×3): 40 ug via INTRAVENOUS

## 2017-09-17 MED ORDER — MIDAZOLAM HCL 2 MG/2ML IJ SOLN
INTRAMUSCULAR | Status: AC
  Filled 2017-09-17: qty 2

## 2017-09-17 MED ORDER — ONDANSETRON HCL 4 MG/2ML IJ SOLN
INTRAMUSCULAR | Status: DC | PRN
  Administered 2017-09-17: 4 mg via INTRAVENOUS

## 2017-09-17 MED ORDER — LACTATED RINGERS IV SOLN
INTRAVENOUS | Status: DC
  Administered 2017-09-17: 08:00:00 via INTRAVENOUS
  Administered 2017-09-17: 125 mL/h via INTRAVENOUS

## 2017-09-17 MED ORDER — FENTANYL CITRATE (PF) 100 MCG/2ML IJ SOLN
INTRAMUSCULAR | Status: AC
  Filled 2017-09-17: qty 2

## 2017-09-17 MED ORDER — OXYCODONE HCL 5 MG PO TABS: 5.0000 mg | ORAL_TABLET | Freq: Once | ORAL | Status: DC | PRN

## 2017-09-17 MED ORDER — FENTANYL CITRATE (PF) 100 MCG/2ML IJ SOLN
INTRAMUSCULAR | Status: DC | PRN
  Administered 2017-09-17 (×2): 50 ug via INTRAVENOUS

## 2017-09-17 MED ORDER — LIDOCAINE HCL (CARDIAC) PF 100 MG/5ML IV SOSY
PREFILLED_SYRINGE | INTRAVENOUS | Status: AC
  Filled 2017-09-17: qty 5

## 2017-09-17 MED ORDER — MEPERIDINE HCL 25 MG/ML IJ SOLN: 6.2500 mg | INTRAMUSCULAR | Status: DC | PRN

## 2017-09-17 MED ORDER — ONDANSETRON HCL 4 MG/2ML IJ SOLN
INTRAMUSCULAR | Status: AC
  Filled 2017-09-17: qty 2

## 2017-09-17 MED ORDER — PROPOFOL 10 MG/ML IV BOLUS
INTRAVENOUS | Status: AC
  Filled 2017-09-17: qty 20

## 2017-09-17 MED ORDER — IBUPROFEN 600 MG PO TABS: ORAL_TABLET | ORAL | 0 refills | Status: DC

## 2017-09-17 SURGICAL SUPPLY — 17 items
BIPOLAR CUTTING LOOP 21FR (ELECTRODE) ×1
BOOTIES KNEE HIGH SLOAN (MISCELLANEOUS) ×4 IMPLANT
CANISTER SUCT 3000ML PPV (MISCELLANEOUS) ×2 IMPLANT
CATH ROBINSON RED A/P 16FR (CATHETERS) ×2 IMPLANT
DILATOR CANAL MILEX (MISCELLANEOUS) IMPLANT
ELECT REM PT RETURN 9FT ADLT (ELECTROSURGICAL) ×2
ELECTRODE REM PT RTRN 9FT ADLT (ELECTROSURGICAL) ×1 IMPLANT
GLOVE BIOGEL PI IND STRL 7.0 (GLOVE) ×1 IMPLANT
GLOVE BIOGEL PI INDICATOR 7.0 (GLOVE) ×1
GLOVE SURG SS PI 6.5 STRL IVOR (GLOVE) ×2 IMPLANT
GOWN STRL REUS W/TWL LRG LVL3 (GOWN DISPOSABLE) ×4 IMPLANT
LOOP CUTTING BIPOLAR 21FR (ELECTRODE) ×1 IMPLANT
PACK VAGINAL MINOR WOMEN LF (CUSTOM PROCEDURE TRAY) ×2 IMPLANT
PAD OB MATERNITY 4.3X12.25 (PERSONAL CARE ITEMS) ×2 IMPLANT
TOWEL OR 17X24 6PK STRL BLUE (TOWEL DISPOSABLE) ×4 IMPLANT
TUBING AQUILEX INFLOW (TUBING) ×2 IMPLANT
TUBING AQUILEX OUTFLOW (TUBING) ×2 IMPLANT

## 2017-09-17 NOTE — Anesthesia Procedure Notes (Signed)
Procedure Name: LMA Insertion Date/Time: 09/17/2017 7:29 AM Performed by: Talbot Grumbling, CRNA Pre-anesthesia Checklist: Patient identified, Emergency Drugs available, Suction available and Patient being monitored Patient Re-evaluated:Patient Re-evaluated prior to induction Oxygen Delivery Method: Circle system utilized Preoxygenation: Pre-oxygenation with 100% oxygen Induction Type: IV induction Ventilation: Mask ventilation without difficulty LMA: LMA inserted LMA Size: 4.0 Number of attempts: 1 Placement Confirmation: positive ETCO2 and breath sounds checked- equal and bilateral Tube secured with: Tape Dental Injury: Teeth and Oropharynx as per pre-operative assessment

## 2017-09-17 NOTE — Discharge Instructions (Signed)
D & C Hysteroscopy Refer to this sheet in the next few weeks. These instructions provide you with information on caring for yourself after your procedure. Your health care provider may also give you more specific instructions. Your treatment has been planned according to current medical practices, but problems sometimes occur. Call your health care provider if you have any problems or questions after your procedure. What can I expect after the procedure? After your procedure, it is typical to have the following:  You may have some cramping. This normally lasts for a couple days.  You may have bleeding. This can vary from light spotting for a few days to menstrual-like bleeding for 3-7 days.  Follow these instructions at home:  Rest for the first 1-2 days after the procedure.  Only take over-the-counter or prescription medicines as directed by your health care provider. Do not take aspirin. It can increase the chances of bleeding.  YOU MAY BEGIN TAKING IBUPROFEN AFTER 1:42 PM TODAY  Take showers instead of baths for 2 weeks or as directed by your health care provider.  Do not drive for 24 hours or as directed.  Do not drink alcohol while taking pain medicine.  Do not use tampons, douche, or have sexual intercourse for 2 weeks or until your health care provider says it is okay.  Take your temperature twice a day for 4-5 days. Write it down each time.  Follow your health care provider's advice about diet, exercise, and lifting.  If you develop constipation, you may: ? Take a mild laxative if your health care provider approves. ? Add bran foods to your diet. ? Drink enough fluids to keep your urine clear or pale yellow.  Try to have someone with you or available to you for the first 24-48 hours, especially if you were given a general anesthetic.  Follow up with your health care provider as directed. Contact a health care provider if:  You feel dizzy or lightheaded.  You feel sick  to your stomach (nauseous).  You have abnormal vaginal discharge.  You have a rash.  You have pain that is not controlled with medicine. Get help right away if:  You have bleeding that is heavier than a normal menstrual period.  You have a fever.  You have increasing cramps or pain, not controlled with medicine.  You have new belly (abdominal) pain.  You pass out.  You have pain in the tops of your shoulders (shoulder strap areas).  You have shortness of breath. This information is not intended to replace advice given to you by your health care provider. Make sure you discuss any questions you have with your health care provider.

## 2017-09-17 NOTE — H&P (Signed)
History and Physical Interval Note:   09/17/2017   7:15 AM   Stephanie Trujillo  has presented today for surgery, with the diagnosis of Abnormal Uterine Bleeding  The various methods of treatment have been discussed with the patient and family. After consideration of risks, benefits and other options for treatment, the patient has consented to  Procedure(s): Grubbs as a surgical intervention .  I have reviewed the patients' chart and labs.  Questions were answered to the patient's satisfaction.     Eldred Manges  MD

## 2017-09-17 NOTE — Transfer of Care (Signed)
Immediate Anesthesia Transfer of Care Note  Patient: Stephanie Trujillo  Procedure(s) Performed: DILATATION & CURETTAGE/HYSTEROSCOPY WITH RESECTOCOPE (N/A )  Patient Location: PACU  Anesthesia Type:General  Level of Consciousness: sedated  Airway & Oxygen Therapy: Patient Spontanous Breathing and Patient connected to nasal cannula oxygen  Post-op Assessment: Report given to RN and Post -op Vital signs reviewed and stable  Post vital signs: Reviewed and stable  Last Vitals:  Vitals Value Taken Time  BP    Temp    Pulse    Resp    SpO2      Last Pain:  Vitals:   09/17/17 0624  TempSrc: Oral      Patients Stated Pain Goal: 4 (28/24/17 5301)  Complications: No apparent anesthesia complications

## 2017-09-17 NOTE — Op Note (Signed)
09/17/2017  8:07 AM  PATIENT:  Stephanie Trujillo  52 y.o. female  PRE-OPERATIVE DIAGNOSIS:  Abnormal Uterine Bleeding  POST-OPERATIVE DIAGNOSIS:  Abnormal Uterine Bleeding  PROCEDURE:  Procedure(s): DILATATION & CURETTAGE/HYSTEROSCOPY WITH RESECTOCOPE  SURGEON:  Eldred Manges, MD  ASSISTANTS: none  ANESTHESIA:   general  ESTIMATED BLOOD LOSS: 25 mL   BLOOD ADMINISTERED:none  COMPLICATIONS:none  FINDINGS: Uterus sounded to 9 cm.  At the time of hysteroscopy The endometrial cavity was noted to be scarred with significant adhesions status post endometrial ablation.  The tubal ostia could not be identified.  Curettage of the endometrial cavity revealed that there was a thick dividing line in the center of the endometrial cavity and the hysteroscope could be passed on either side but the thick adhesion was not amenable to removal with curettage.   FLUID DEFICIT:450cc  LOCAL MEDICATIONS USED:  LIDOCAINE  and Amount: 10 ml  SPECIMEN:  Source of Specimen:  1)endocervical   2) endometrial  DISPOSITION OF SPECIMEN:  PATHOLOGY  COUNTS:  YES  DESCRIPTION OF PROCEDURE:the patient was taken to the operating room after appropriate identification and placed on the operating table. After the attainment of adequate general anesthesia she was placed in the lithotomy position.  Timeout was performed.  The perineum and vagina were prepped with multiple layers of Betadine. The bladder was emptied with a an in and out catheter. The perineum was draped in sterile field. A Graves speculum was placed in the vagina. The cervix was grasped with a single-tooth tenaculum. A paracervical block was achieved with a total of 10 cc of 2% Xylocaine and the 5 and 7:00 positions. The uterus was sounded to 9 cm.  The cervix was then dilated to accommodate the diagnostic hysteroscope. The hysteroscope was used to evaluate all quadrants of the uterus. The endometrial cavity was noted to be scarred with significant  adhesions status post endometrial ablation.  The tubal ostia could not be identified.  Curettage of the endometrial cavity revealed that there was a thick dividing line in the center of the endometrial cavity and the hysteroscope could be passed on either side but the thick adhesion was not amenable to removal with curettage.  Prior to hysteroscopy the endocervix had been curetted and those curettings sent as a separate specimen.  Endometrial curettings were obtained on curettage of the endometrial cavity and sent as a specimen. All instruments were then removed from the vagina and the patient was awakened from general anesthesia and taken to the recovery room in satisfactory condition having tolerated the procedure well sponge and instrument counts correct.  PLAN OF CARE: Discharge home after postanesthesia care  PATIENT DISPOSITION:  PACU - hemodynamically stable.   Delay start of Pharmacological VTE agent (>24hrs) due to surgical blood loss or risk of bleeding:  Yes.  SCD was used during the procedure.   Eldred Manges, MD 8:07 AM

## 2017-09-18 NOTE — Anesthesia Postprocedure Evaluation (Signed)
Anesthesia Post Note  Patient: Stephanie Trujillo  Procedure(s) Performed: DILATATION & CURETTAGE/HYSTEROSCOPY WITH RESECTOCOPE (N/A )     Patient location during evaluation: PACU Anesthesia Type: General Level of consciousness: sedated and patient cooperative Pain management: pain level controlled Vital Signs Assessment: post-procedure vital signs reviewed and stable Respiratory status: spontaneous breathing Cardiovascular status: stable Anesthetic complications: no    Last Vitals:  Vitals:   09/17/17 0920 09/17/17 1000  BP:  122/71  Pulse: 67 63  Resp: 18 16  Temp:  36.4 C  SpO2: 100% 100%    Last Pain:  Vitals:   09/17/17 1000  TempSrc:   PainSc: 3    Pain Goal: Patients Stated Pain Goal: 4 (09/17/17 4462)               Nolon Nations

## 2017-09-19 ENCOUNTER — Encounter (HOSPITAL_COMMUNITY): Payer: Self-pay | Admitting: Obstetrics and Gynecology

## 2017-09-24 ENCOUNTER — Encounter

## 2017-10-20 ENCOUNTER — Ambulatory Visit: Payer: BLUE CROSS/BLUE SHIELD | Admitting: Cardiovascular Disease

## 2017-10-21 ENCOUNTER — Encounter: Payer: Self-pay | Admitting: Cardiovascular Disease

## 2017-10-21 ENCOUNTER — Ambulatory Visit: Payer: BLUE CROSS/BLUE SHIELD | Admitting: Cardiovascular Disease

## 2017-10-21 VITALS — BP 120/82 | HR 71 | Ht 62.5 in | Wt 158.2 lb

## 2017-10-21 DIAGNOSIS — E7800 Pure hypercholesterolemia, unspecified: Secondary | ICD-10-CM

## 2017-10-21 DIAGNOSIS — I471 Supraventricular tachycardia: Secondary | ICD-10-CM | POA: Diagnosis not present

## 2017-10-21 DIAGNOSIS — R030 Elevated blood-pressure reading, without diagnosis of hypertension: Secondary | ICD-10-CM | POA: Diagnosis not present

## 2017-10-21 NOTE — Progress Notes (Signed)
Cardiology Office Note   Date:  10/21/2017   ID:  Stephanie Trujillo, DOB 28-Aug-1965, MRN [id number]  PCP:  Trujillo, Stephanie Krauss, MD  Cardiologist:   Stephanie Latch, MD   No chief complaint on file.   History of Present Illness: Stephanie Trujillo is a 52 y.o. female with SVT and uterine fibroids who presents for follow-up.  She previously saw Stephanie Trujillo after an emergency room visit for SVT.  She had an episode in 2016 that was treated with adenosine.  She was instructed to start diltiazem but never used it because it was as needed.  At that time she saw Stephanie Trujillo and had an echo that revealed LVEF 60-65% with normal diastolic function.  She was seen in the hospital 06/22/16 with SVT.  By the time she arrived her heart rate was slowing and it spontaneously broke.  She followed up with Stephanie Trujillo and had not had any recurrent episodes.  She had occasional episodes of fluttering.  She was given another prescription for diltiazem as needed. She has not needed to take this.  She has occasional heart fluttering and feels as though she is about to go into SVT.  However she has been able to control it with vagal maneuvers.  She also notes occasional heart palpitations that last for a second or 2.  She has episodes of lightheadedness and dizziness but not necessarily at the same time as when she is having palpitations.  Stephanie Trujillo is a patient of Stephanie Trujillo.  In visiting with Stephanie Trujillo and other physicians she is noted that her blood pressure is starting to increase. At her last appointment her BP was controlled and she was asked to track it at home.  She brings a log of her blood pressure showing that it is been mostly in the 110s to 120s over 70s to low 80s.  She had one episode in the 90s.  Overall she has been feeling well.  She has occasional palpitations that last for a second or 2.  She has not had any sustained arrhythmias and has not needed to take any diltiazem for SVT.  She continues to  exercise regularly and has no exertional chest pain or shortness of breath.  She notices that when she lost weight she has more palpitations.  She has not noted any lower extremity edema, orthopnea, or PND.  She struggles with eating out frequently.  She struggles with high salt intake because they have been cooking at home much lately.    Past Medical History:  Diagnosis Date  . Abnormal Pap smear    laser surgery '88  . Anemia 02/2008  . Bacterial vaginosis 2005  . Condylomata acuminata   . Ecchymosis 2011  . Endometrial polyp 08/02/03  . FH: diabetes mellitus   . FH: hypertension   . FH: lupus   . FH: rheumatoid arthritis   . FH: stroke   . FH: thyroid disease   . Fibroid 2009  . H/O candidiasis   . H/O cervicitis 2002  . H/O dysmenorrhea 2005  . H/O vaginal bleeding 04/2010  . H/O varicella   . H/O: menorrhagia 08/02/2003  . Hair loss 2008  . 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 Hx of irritable bowel syndrome    Not problems in years   . Hx of ovarian cyst 2008  . Irregular menses   . Mastodynia, female 08/2010   transient   . Pelvic pain in female 2012  transient  . 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 SVT (supraventricular tachycardia) (HCC)    tx diltiazem prn  . Vulvar lesion 09/2007  . Weight loss 07/2006    Past Surgical History:  Procedure Laterality Date  . ABDOMINOPLASTY    . ABLATION  2010  . BREAST CYST ASPIRATION    . CESAREAN SECTION  1995  . DILATATION & CURRETTAGE/HYSTEROSCOPY WITH RESECTOCOPE N/A 09/17/2017   Procedure: DILATATION & CURETTAGE/HYSTEROSCOPY WITH RESECTOCOPE;  Surgeon: Stephanie Manges, MD;  Location: [facility];  Service: Gynecology;  Laterality: N/A;  . HERNIA REPAIR    . OVARIAN CYST REMOVAL  2001  . WISDOM TOOTH EXTRACTION  1990     Current Outpatient Medications  Medication Sig Dispense Refill  . Calcium Carb-Cholecalciferol (CALCIUM + VITAMIN D3 PO) Take 1 tablet by mouth daily.    . clobetasol ointment (TEMOVATE) 6.57 % Apply 1 application  topically 2 (two) times daily as needed (for eczema).    . diltiazem (CARDIZEM) 30 MG tablet Take 1 tablet (30 mg total) by mouth every 6 (six) hours as needed (palpitation). (Patient taking differently: Take 30 mg by mouth every 6 (six) hours as needed (for SVT). ) 20 tablet 11  . fluocinonide cream (LIDEX) 8.46 % Apply 1 application topically 2 (two) times daily as needed (eczema).     . triamcinolone cream (KENALOG) 0.1 % Apply 1 application topically 2 (two) times daily as needed (itching/eczema).     . valACYclovir (VALTREX) 500 MG tablet Take 500-1,000 mg by mouth daily as needed (for outbreaks/shingles).      No current facility-administered medications for this visit.     Allergies:   Codeine    Social History:  The patient  reports that she has never smoked. She has never used smokeless tobacco. She reports that she drinks alcohol. She reports that she does not use drugs.   Family History:  The patient's family history includes Arrhythmia in her father; Breast cancer in her cousin and cousin; Diabetes in her paternal grandmother; Hypertension in her father; Lupus in her mother; Rheum arthritis in her maternal aunt and sister.    ROS:  Please see the history of present illness.   Otherwise, review of systems are positive for none.   All other systems are reviewed and negative.    PHYSICAL EXAM: VS:  BP 120/82   Pulse 71   Ht 5' 2.5" (1.588 m)   Wt 158 lb 3.2 oz (71.8 kg)   SpO2 98%   BMI 28.47 kg/m  , BMI Body mass index is 28.47 kg/m. GENERAL:  Well appearing HEENT: Pupils equal round and reactive, fundi not visualized, oral mucosa unremarkable NECK:  No jugular venous distention, waveform within normal limits, carotid upstroke brisk and symmetric, no bruits LUNGS:  Clear to auscultation bilaterally HEART:  RRR.  PMI not displaced or sustained,S1 and S2 within normal limits, no S3, no S4, no clicks, no rubs, no murmurs ABD:  Flat, positive bowel sounds normal in frequency  in pitch, no bruits, no rebound, no guarding, no midline pulsatile mass, no hepatomegaly, no splenomegaly EXT:  2 plus pulses throughout, no edema, no cyanosis no clubbing SKIN:  No rashes no nodules NEURO:  Cranial nerves II through XII grossly intact, motor grossly intact throughout PSYCH:  Cognitively intact, oriented to person place and time   EKG:  EKG is ordered today. The ekg ordered 04/29/17 demonstrates sinus rhythm.  Rate 69 bpm.   10/21/17:  sinus rhythm.  Rate 71 bpm.  Echo 08/23/14: Study Conclusions  - Left ventricle: The cavity size was normal. Wall thickness was   normal. Systolic function was normal. The estimated ejection   fraction was in the range of 60% to 65%. Wall motion was normal;   there were no regional wall motion abnormalities. Left   ventricular diastolic function parameters were normal. - Left atrium: The atrium was normal in size.  Impressions:  - Normal study.  ETT 08/28/14: Normal  Recent Labs: 09/17/2017: Hemoglobin 13.0; Platelets 288   06/18/16: Total cholesterol 208, triglycerides 89, HDL 60, LDL 121  Lipid Panel No results found for: CHOL, TRIG, HDL, CHOLHDL, VLDL, LDLCALC, LDLDIRECT    Wt Readings from Last 3 Encounters:  10/21/17 158 lb 3.2 oz (71.8 kg)  08/30/17 155 lb (70.3 kg)  04/29/17 161 lb (73 kg)      ASSESSMENT AND PLAN:  # SVT: Well-controlled.  Continue prn diltiazem.  # Elevated blood pressure: BP well-controlled at home.  She has stage 1 hypertension with diastolic BP in the 31R at times.   She was congratulated on her exercise efforts and encouraged to limit her salt intake.  She was provided with information about the DASH diet.  # Hyperlipidemia: LDL was 121.  However her ASCVD 10-year risk is 1%.  Therefore we will not start aspirin or statin.  She was encouraged to continue exercising regularly and to limit her use of fried foods, fatty foods, and cheeses.  She is Artie doing excellent with this.  Current  medicines are reviewed at length with the patient today.  The patient does not have concerns regarding medicines.  The following changes have been made:  no change  Labs/ tests ordered today include:  No orders of the defined types were placed in this encounter.    Disposition:   FU with Shamus Desantis C. Oval Linsey, MD, [facility] in 1 year.     Signed, Cornell Gaber C. Oval Linsey, MD, [facility]  10/21/2017 9:23 AM    [facility]

## 2017-10-21 NOTE — Patient Instructions (Addendum)
Medication Instructions:  Your physician recommends that you continue on your current medications as directed. Please refer to the Current Medication list given to you today.  Labwork: none  Testing/Procedures: none  Follow-Up: Your physician wants you to follow-up in: 1 year You will receive a reminder letter in the mail two months in advance. If you don't receive a letter, please call our office to schedule the follow-up appointment.  If you need a refill on your cardiac medications before your next appointment, please call your pharmacy.   DASH Eating Plan DASH stands for "Dietary Approaches to Stop Hypertension." The DASH eating plan is a healthy eating plan that has been shown to reduce high blood pressure (hypertension). It may also reduce your risk for type 2 diabetes, heart disease, and stroke. The DASH eating plan may also help with weight loss. What are tips for following this plan? General guidelines  Avoid eating more than 2,300 mg (milligrams) of salt (sodium) a day. If you have hypertension, you may need to reduce your sodium intake to 1,500 mg a day.  Limit alcohol intake to no more than 1 drink a day for nonpregnant women and 2 drinks a day for men. One drink equals 12 oz of beer, 5 oz of wine, or 1 oz of hard liquor.  Work with your health care provider to maintain a healthy body weight or to lose weight. Ask what an ideal weight is for you.  Get at least 30 minutes of exercise that causes your heart to beat faster (aerobic exercise) most days of the week. Activities may include walking, swimming, or biking.  Work with your health care provider or diet and nutrition specialist (dietitian) to adjust your eating plan to your individual calorie needs. Reading food labels  Check food labels for the amount of sodium per serving. Choose foods with less than 5 percent of the Daily Value of sodium. Generally, foods with less than 300 mg of sodium per serving fit into this  eating plan.  To find whole grains, look for the word "whole" as the first word in the ingredient list. Shopping  Buy products labeled as "low-sodium" or "no salt added."  Buy fresh foods. Avoid canned foods and premade or frozen meals. Cooking  Avoid adding salt when cooking. Use salt-free seasonings or herbs instead of table salt or sea salt. Check with your health care provider or pharmacist before using salt substitutes.  Do not fry foods. Cook foods using healthy methods such as baking, boiling, grilling, and broiling instead.  Cook with heart-healthy oils, such as olive, canola, soybean, or sunflower oil. Meal planning   Eat a balanced diet that includes: ? 5 or more servings of fruits and vegetables each day. At each meal, try to fill half of your plate with fruits and vegetables. ? Up to 6-8 servings of whole grains each day. ? Less than 6 oz of lean meat, poultry, or fish each day. A 3-oz serving of meat is about the same size as a deck of cards. One egg equals 1 oz. ? 2 servings of low-fat dairy each day. ? A serving of nuts, seeds, or beans 5 times each week. ? Heart-healthy fats. Healthy fats called Omega-3 fatty acids are found in foods such as flaxseeds and coldwater fish, like sardines, salmon, and mackerel.  Limit how much you eat of the following: ? Canned or prepackaged foods. ? Food that is high in trans fat, such as fried foods. ? Food that is high  in saturated fat, such as fatty meat. ? Sweets, desserts, sugary drinks, and other foods with added sugar. ? Full-fat dairy products.  Do not salt foods before eating.  Try to eat at least 2 vegetarian meals each week.  Eat more home-cooked food and less restaurant, buffet, and fast food.  When eating at a restaurant, ask that your food be prepared with less salt or no salt, if possible. What foods are recommended? The items listed may not be a complete list. Talk with your dietitian about what dietary choices  are best for you. Grains Whole-grain or whole-wheat bread. Whole-grain or whole-wheat pasta. Brown rice. Stephanie Trujillo. Bulgur. Whole-grain and low-sodium cereals. Pita bread. Low-fat, low-sodium crackers. Whole-wheat flour tortillas. Vegetables Fresh or frozen vegetables (raw, steamed, roasted, or grilled). Low-sodium or reduced-sodium tomato and vegetable juice. Low-sodium or reduced-sodium tomato sauce and tomato paste. Low-sodium or reduced-sodium canned vegetables. Fruits All fresh, dried, or frozen fruit. Canned fruit in natural juice (without added sugar). Meat and other protein foods Skinless chicken or [place]. Ground chicken or [place]. Pork with fat trimmed off. Fish and seafood. Egg whites. Dried beans, peas, or lentils. Unsalted nuts, nut butters, and seeds. Unsalted canned beans. Lean cuts of beef with fat trimmed off. Low-sodium, lean deli meat. Dairy Low-fat (1%) or fat-free (skim) milk. Fat-free, low-fat, or reduced-fat cheeses. Nonfat, low-sodium ricotta or cottage cheese. Low-fat or nonfat yogurt. Low-fat, low-sodium cheese. Fats and oils Soft margarine without trans fats. Vegetable oil. Low-fat, reduced-fat, or light mayonnaise and salad dressings (reduced-sodium). Canola, safflower, olive, soybean, and sunflower oils. Avocado. Seasoning and other foods Herbs. Spices. Seasoning mixes without salt. Unsalted popcorn and pretzels. Fat-free sweets. What foods are not recommended? The items listed may not be a complete list. Talk with your dietitian about what dietary choices are best for you. Grains Baked goods made with fat, such as croissants, muffins, or some breads. Dry pasta or rice meal packs. Vegetables Creamed or fried vegetables. Vegetables in a cheese sauce. Regular canned vegetables (not low-sodium or reduced-sodium). Regular canned tomato sauce and paste (not low-sodium or reduced-sodium). Regular tomato and vegetable juice (not low-sodium or reduced-sodium). Stephanie Trujillo.  Olives. Fruits Canned fruit in a light or heavy syrup. Fried fruit. Fruit in cream or butter sauce. Meat and other protein foods Fatty cuts of meat. Ribs. Fried meat. Stephanie Trujillo. Sausage. Bologna and other processed lunch meats. Salami. Fatback. Hotdogs. Bratwurst. Salted nuts and seeds. Canned beans with added salt. Canned or smoked fish. Whole eggs or egg yolks. Chicken or [place] with skin. Dairy Whole or 2% milk, cream, and half-and-half. Whole or full-fat cream cheese. Whole-fat or sweetened yogurt. Full-fat cheese. Nondairy creamers. Whipped toppings. Processed cheese and cheese spreads. Fats and oils Butter. Stick margarine. Lard. Shortening. Ghee. Bacon fat. Tropical oils, such as coconut, palm kernel, or palm oil. Seasoning and other foods Salted popcorn and pretzels. Onion salt, garlic salt, seasoned salt, table salt, and sea salt. Worcestershire sauce. Tartar sauce. Barbecue sauce. Teriyaki sauce. Soy sauce, including reduced-sodium. Steak sauce. Canned and packaged gravies. Fish sauce. Oyster sauce. Cocktail sauce. Horseradish that you find on the shelf. Ketchup. Mustard. Meat flavorings and tenderizers. Bouillon cubes. Hot sauce and Tabasco sauce. Premade or packaged marinades. Premade or packaged taco seasonings. Relishes. Regular salad dressings. Where to find more information:  National Heart, Lung, and [place]: https://wilson-eaton.com/  American Heart Association: www.heart.org Summary  The DASH eating plan is a healthy eating plan that has been shown to reduce high blood pressure (hypertension). It may  also reduce your risk for type 2 diabetes, heart disease, and stroke.  With the DASH eating plan, you should limit salt (sodium) intake to 2,300 mg a day. If you have hypertension, you may need to reduce your sodium intake to 1,500 mg a day.  When on the DASH eating plan, aim to eat more fresh fruits and vegetables, whole grains, lean proteins, low-fat dairy, and heart-healthy  fats.  Work with your health care provider or diet and nutrition specialist (dietitian) to adjust your eating plan to your individual calorie needs. This information is not intended to replace advice given to you by your health care provider. Make sure you discuss any questions you have with your health care provider. Document Released: 03/05/2011 Document Revised: 03/09/2016 Document Reviewed: 03/09/2016 Elsevier Interactive Patient Education  [employer].

## 2017-10-25 ENCOUNTER — Encounter: Payer: Self-pay | Admitting: Cardiovascular Disease

## 2017-11-28 ENCOUNTER — Encounter: Payer: Self-pay | Admitting: Nurse Practitioner

## 2017-11-28 ENCOUNTER — Ambulatory Visit: Payer: Self-pay | Admitting: Nurse Practitioner

## 2017-11-28 VITALS — BP 130/84 | HR 93 | Temp 99.1°F | Resp 18 | Ht 63.0 in | Wt 156.4 lb

## 2017-11-28 DIAGNOSIS — J029 Acute pharyngitis, unspecified: Secondary | ICD-10-CM

## 2017-11-28 MED ORDER — ONDANSETRON HCL 4 MG PO TABS: 4.0000 mg | ORAL_TABLET | Freq: Three times a day (TID) | ORAL | 0 refills | Status: AC | PRN

## 2017-11-28 MED ORDER — AMOXICILLIN 875 MG PO TABS: 875.0000 mg | ORAL_TABLET | Freq: Two times a day (BID) | ORAL | 0 refills | Status: AC

## 2017-11-28 NOTE — Progress Notes (Signed)
Subjective:     Stephanie Trujillo is a 52 y.o. female who presents for evaluation of sore throat. Associated symptoms include suspected fevers but not measured at home, sinus and nasal congestion, sore throat, swollen glands and fatigue, nausea. Onset of symptoms was 2 days ago, and have been gradually worsening since that time. She is drinking plenty of fluids. She has had a recent close exposure to someone with proven streptococcal pharyngitis.  Patient rates throat pain 6/10 at present.  Patient has taken a Benadryl tablet for her symptoms with minimal relief.  The following portions of the patient's history were reviewed and updated as appropriate: allergies, current medications and past medical history.  Review of Systems Constitutional: positive for fatigue and fevers, negative for anorexia, chills and malaise Eyes: negative Ears, nose, mouth, throat, and face: positive for nasal congestion and sore throat, negative for ear drainage, earaches and hoarseness Respiratory: negative Cardiovascular: negative Gastrointestinal: positive for nausea, negative for abdominal pain, change in bowel habits, constipation, diarrhea and vomiting Neurological: positive for headaches, negative for paresthesia, seizures, speech problems, tremors, vertigo and weakness Allergic/Immunologic: negative    Objective:    BP 130/84 (BP Location: Right Arm, Patient Position: Sitting, Cuff Size: Normal)   Pulse 93   Temp 99.1 F (37.3 C) (Oral)   Resp 18   Ht 5\' 3"  (1.6 m)   Wt 156 lb 6.4 oz (70.9 kg)   LMP  (LMP Unknown)   SpO2 98%   BMI 27.71 kg/m  General appearance: alert, cooperative, fatigued and no distress Head: Normocephalic, without obvious abnormality, atraumatic Eyes: conjunctivae/corneas clear. PERRL, EOM's intact. Fundi benign. Ears: normal TM's and external ear canals both ears Nose: Nares normal. Septum midline. Mucosa normal. No drainage or sinus tenderness. Throat: abnormal findings:  moderate oropharyngeal erythema and white pocket on left tonsil, +1 tonsil swelling bilaterally Lungs: clear to auscultation bilaterally Heart: regular rate and rhythm, S1, S2 normal, no murmur, click, rub or gallop Abdomen: soft, non-tender; bowel sounds normal; no masses,  no organomegaly Pulses: 2+ and symmetric Skin: Skin color, texture, turgor normal. No rashes or lesions Lymph nodes: cervical nodes normal Neurologic: Grossly normal  Laboratory Strep test done. Results:negative.    Assessment:  Acute Pharyngitis   Plan:   Exam findings, diagnosis etiology and medication use and indications reviewed with patient. Follow- Up and discharge instructions provided. No emergent/urgent issues found on exam.  The patient's clinical presentation, patient and I both agreed to go ahead and treat with antibiotics.  Patient was also given medication for nausea.  Patient education was provided. Patient verbalized understanding of information provided and agrees with plan of care (POC), all questions answered. The patient is advised to call or return to clinic if he does not see an improvement in symptoms, or to seek the care of the closest emergency department if he worsens with the above plan.   1. Sore throat  - POCT rapid strep A - amoxicillin (AMOXIL) 875 MG tablet; Take 1 tablet (875 mg total) by mouth 2 (two) times daily for 10 days.  Dispense: 20 tablet; Refill: 0 -Take medications as prescribed. -Increase fluids. -Ibuprofen 800mg  every 8 hours for 3 days. Take with   food and water. -Warm salt water gargles at least 3-4 times daily until symptoms improve. -May use a teaspoon of honey as needed for throat discomfort. -Change toothbrush after 3 days. -Follow-up in the ER if you are unable to swallow, have difficulty breathing, or have drooling. -Follow-up in our  clinic as needed.  2. Pharyngitis, unspecified etiology  - amoxicillin (AMOXIL) 875 MG tablet; Take 1 tablet (875 mg total) by  mouth 2 (two) times daily for 10 days.  Dispense: 20 tablet; Refill: 0 - ondansetron (ZOFRAN) 4 MG tablet; Take 1 tablet (4 mg total) by mouth every 8 (eight) hours as needed for up to 3 days for nausea or vomiting.  Dispense: 10 tablet; Refill: 0 -Take medications as prescribed. -Increase fluids. -Ibuprofen 800mg  every 8 hours for 3 days. Take with   food and water. -Warm salt water gargles at least 3-4 times daily until symptoms improve. -May use a teaspoon of honey as needed for throat discomfort. -Change toothbrush after 3 days. -Follow-up in the ER if you are unable to swallow, have difficulty breathing, or have drooling. -Follow-up in our clinic as needed.

## 2017-11-28 NOTE — Patient Instructions (Addendum)
Pharyngitis -Take medications as prescribed. -Increase fluids. -Ibuprofen 800mg  every 8 hours for 3 days. Take with   food and water. -Warm salt water gargles at least 3-4 times daily until symptoms improve. -May use a teaspoon of honey as needed for throat discomfort. -Change toothbrush after 3 days. -Follow-up in the ER if you are unable to swallow, have difficulty breathing, or have drooling. -Follow-up in our clinic as needed.  Pharyngitis is redness, pain, and swelling (inflammation) of the throat (pharynx). It is a very common cause of sore throat. Pharyngitis can be caused by a bacteria, but it is usually caused by a virus. Most cases of pharyngitis get better on their own without treatment. What are the causes? This condition may be caused by:  Infection by viruses (viral). Viral pharyngitis spreads from person to person (is contagious) through coughing, sneezing, and sharing of personal items or utensils such as cups, forks, spoons, and toothbrushes.  Infection by bacteria (bacterial). Bacterial pharyngitis may be spread by touching the nose or face after coming in contact with the bacteria, or through more intimate contact, such as kissing.  Allergies. Allergies can cause buildup of mucus in the throat (post-nasal drip), leading to inflammation and irritation. Allergies can also cause blocked nasal passages, forcing breathing through the mouth, which dries and irritates the throat.  What increases the risk? You are more likely to develop this condition if:  You are 50-50 years old.  You are exposed to crowded environments such as daycare, school, or dormitory living.  You live in a cold climate.  You have a weakened disease-fighting (immune) system.  What are the signs or symptoms? Symptoms of this condition vary by the cause (viral, bacterial, or allergies) and can include:  Sore throat.  Fatigue.  Low-grade fever.  Headache.  Joint pain and muscle aches.  Skin  rashes.  Swollen glands in the throat (lymph nodes).  Plaque-like film on the throat or tonsils. This is often a symptom of bacterial pharyngitis.  Vomiting.  Stuffy nose (nasal congestion).  Cough.  Red, itchy eyes (conjunctivitis).  Loss of appetite.  How is this diagnosed? This condition is often diagnosed based on your medical history and a physical exam. Your health care provider will ask you questions about your illness and your symptoms. A swab of your throat may be done to check for bacteria (rapid strep test). Other lab tests may also be done, depending on the suspected cause, but these are rare. How is this treated? This condition usually gets better in 3-4 days without medicine. Bacterial pharyngitis may be treated with antibiotic medicines. Follow these instructions at home:  Take over-the-counter and prescription medicines only as told by your health care provider. ? If you were prescribed an antibiotic medicine, take it as told by your health care provider. Do not stop taking the antibiotic even if you start to feel better. ? Do not give children aspirin because of the association with Reye syndrome.  Drink enough water and fluids to keep your urine clear or pale yellow.  Get a lot of rest.  Gargle with a salt-water mixture 3-4 times a day or as needed. To make a salt-water mixture, completely dissolve -1 tsp of salt in 1 cup of warm water.  If your health care provider approves, you may use throat lozenges or sprays to soothe your throat. Contact a health care provider if:  You have large, tender lumps in your neck.  You have a rash.  You cough up  green, yellow-brown, or bloody spit. Get help right away if:  Your neck becomes stiff.  You drool or are unable to swallow liquids.  You cannot drink or take medicines without vomiting.  You have severe pain that does not go away, even after you take medicine.  You have trouble breathing, and it is not  caused by a stuffy nose.  You have new pain and swelling in your joints such as the knees, ankles, wrists, or elbows. Summary  Pharyngitis is redness, pain, and swelling (inflammation) of the throat (pharynx).  While pharyngitis can be caused by a bacteria, the most common causes are viral.  Most cases of pharyngitis get better on their own without treatment.  Bacterial pharyngitis is treated with antibiotic medicines. This information is not intended to replace advice given to you by your health care provider. Make sure you discuss any questions you have with your health care provider. Document Released: 03/16/2005 Document Revised: 04/21/2016 Document Reviewed: 04/21/2016 Elsevier Interactive Patient Education  [employer].

## 2017-11-29 LAB — POCT RAPID STREP A (OFFICE): Rapid Strep A Screen: NEGATIVE

## 2018-03-04 ENCOUNTER — Telehealth: Payer: Self-pay | Admitting: Cardiovascular Disease

## 2018-03-04 NOTE — Telephone Encounter (Signed)
Returned call to patient of Dr. Oval Linsey who reports heart skipping a beat & thud in her chest that started yesterday evening and continued in today. She reports this feels different than SVT - thus has not taken PRN diltiazem. She reports no chest pain (indigestion earlier in the week) but does report feeling tired. She has not monitored her BP or HR. She plans to go to [facility] for evaluation as she is going out of town. Scheduled her for f/up with MD on 12/11 @ 1140am

## 2018-03-04 NOTE — Telephone Encounter (Signed)
° ° °  Patient calling to report "heart skipping a beat" and a "thud" in chest started 1 day ago, also states she is extremely tired.  Patient requesting EKG.

## 2018-03-09 ENCOUNTER — Ambulatory Visit: Payer: BLUE CROSS/BLUE SHIELD | Admitting: Cardiovascular Disease

## 2018-03-28 DIAGNOSIS — R87619 Unspecified abnormal cytological findings in specimens from cervix uteri: Secondary | ICD-10-CM | POA: Insufficient documentation

## 2018-04-04 ENCOUNTER — Ambulatory Visit: Payer: BLUE CROSS/BLUE SHIELD | Admitting: Adult Health

## 2018-04-04 ENCOUNTER — Encounter: Payer: Self-pay | Admitting: Adult Health

## 2018-04-04 ENCOUNTER — Telehealth: Payer: Self-pay | Admitting: Cardiovascular Disease

## 2018-04-04 VITALS — BP 108/75 | HR 83 | Ht 62.0 in | Wt 166.8 lb

## 2018-04-04 DIAGNOSIS — R002 Palpitations: Secondary | ICD-10-CM

## 2018-04-04 DIAGNOSIS — Z79899 Other long term (current) drug therapy: Secondary | ICD-10-CM | POA: Diagnosis not present

## 2018-04-04 DIAGNOSIS — I471 Supraventricular tachycardia: Secondary | ICD-10-CM | POA: Diagnosis not present

## 2018-04-04 DIAGNOSIS — E7800 Pure hypercholesterolemia, unspecified: Secondary | ICD-10-CM | POA: Diagnosis not present

## 2018-04-04 NOTE — Progress Notes (Signed)
Cardiology Office Note   Date:  04/04/2018   ID:  Stephanie Trujillo, DOB Feb 25, 1966, MRN [id number]  PCP:  Day, Jacqlyn Krauss, MD  Cardiologist: Dr.  Oval Linsey Chief Complaint  Patient presents with  . Follow-up  . Palpitations     History of Present Illness: Stephanie Trujillo is a 53 y.o. female who presents for ongoing assessment and management of PSVT, on diltiazem prn. Other history includes HTN and HL. She called our office today with complaints of skipping HR and "thud"in her chest.  She has not taken diltiazem.   She reports that these symptoms are not like SVT where she felt her HR racing. These are extra beats that are hard, come in groups or over a 10-15 minute period of time and then go away on their own. She has no associated symptoms with the exception of becoming annoyed by the frequency. No chest pain, no DOE, no diaphoresis. She states that she felt them a lot over the holiday, but then subsided, however, over the last 3 days she has had several episodes.   She denies caffeine use, no OTC stimulants of decongestants. She is being worked up by infectious diseases for "recurrent shingles" and is now on daily Valtrex 500 mg for suppression therapy. She doubts that diagnosis, but is following up with ID this week.   Past Medical History:  Diagnosis Date  . Abnormal Pap smear    laser surgery '88  . Anemia 02/2008  . Bacterial vaginosis 2005  . Condylomata acuminata   . Ecchymosis 2011  . Endometrial polyp 08/02/03  . FH: diabetes mellitus   . FH: hypertension   . FH: lupus   . FH: rheumatoid arthritis   . FH: stroke   . FH: thyroid disease   . Fibroid 2009  . H/O candidiasis   . H/O cervicitis 2002  . H/O dysmenorrhea 2005  . H/O vaginal bleeding 04/2010  . H/O varicella   . H/O: menorrhagia 08/02/2003  . Hair loss 2008  . 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 Hx of irritable bowel syndrome    Not problems in years   . Hx of ovarian cyst 2008  . Irregular menses   . Mastodynia, female  08/2010   transient   . Pelvic pain in female 2012   transient  . 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 SVT (supraventricular tachycardia) (HCC)    tx diltiazem prn  . Vulvar lesion 09/2007  . Weight loss 07/2006    Past Surgical History:  Procedure Laterality Date  . ABDOMINOPLASTY    . ABLATION  2010  . BREAST CYST ASPIRATION    . CESAREAN SECTION  1995  . DILATATION & CURRETTAGE/HYSTEROSCOPY WITH RESECTOCOPE N/A 09/17/2017   Procedure: DILATATION & CURETTAGE/HYSTEROSCOPY WITH RESECTOCOPE;  Surgeon: Eldred Manges, MD;  Location: [facility];  Service: Gynecology;  Laterality: N/A;  . HERNIA REPAIR    . OVARIAN CYST REMOVAL  2001  . WISDOM TOOTH EXTRACTION  1990     Current Outpatient Medications  Medication Sig Dispense Refill  . clobetasol ointment (TEMOVATE) 1.27 % Apply 1 application topically 2 (two) times daily as needed (for eczema).    . diltiazem (CARDIZEM) 30 MG tablet Take 1 tablet (30 mg total) by mouth every 6 (six) hours as needed (palpitation). (Patient taking differently: Take 30 mg by mouth every 6 (six) hours as needed (for SVT). ) 20 tablet 11  . fluocinonide cream (LIDEX) 5.17 % Apply 1 application topically 2 (two) times  daily as needed (eczema).     . triamcinolone cream (KENALOG) 0.1 % Apply 1 application topically 2 (two) times daily as needed (itching/eczema).     . valACYclovir (VALTREX) 500 MG tablet Take 500-1,000 mg by mouth daily as needed (for outbreaks/shingles).      No current facility-administered medications for this visit.     Allergies:   Codeine    Social History:  The patient  reports that she has never smoked. She has never used smokeless tobacco. She reports current alcohol use. She reports that she does not use drugs.   Family History:  The patient's family history includes Arrhythmia in her father; Breast cancer in her cousin and cousin; Diabetes in her paternal grandmother; Hypertension in her father; Lupus in her mother; Rheum arthritis in  her maternal aunt and sister.    ROS: All other systems are reviewed and negative. Unless otherwise mentioned in H&P    PHYSICAL EXAM: VS:  BP 108/75   Pulse 83   Ht 5\' 2"  (1.575 m)   Wt 166 lb 12.8 oz (75.7 kg)   BMI 30.51 kg/m  , BMI Body mass index is 30.51 kg/m. GEN: Well nourished, well developed, in no acute distress HEENT: normal Neck: no JVD, carotid bruits, or masses Cardiac: RRR; no murmurs, rubs, or gallops,no edema  Respiratory:  Clear to auscultation bilaterally, normal work of breathing GI: soft, nontender, nondistended, + BS MS: no deformity or atrophy Skin: warm and dry, no rash Neuro:  Strength and sensation are intact Psych: euthymic mood, full affect   EKG: Patient refused.   Recent Labs: 09/17/2017: Hemoglobin 13.0; Platelets 288    Lipid Panel No results found for: CHOL, TRIG, HDL, CHOLHDL, VLDL, LDLCALC, LDLDIRECT    Wt Readings from Last 3 Encounters:  04/04/18 166 lb 12.8 oz (75.7 kg)  11/28/17 156 lb 6.4 oz (70.9 kg)  11-09-2017 158 lb 3.2 oz (71.8 kg)      Other studies Reviewed: Echocardiogram 2017/11/09   - Left ventricle: The cavity size was normal. Wall thickness was normal. Systolic function was normal. The estimated ejection fraction was in the range of 60% to 65%. Wall motion was normal; there were no regional wall motion abnormalities. Left ventricular diastolic function parameters were normal. - Left atrium: The atrium was normal in size.   ASSESSMENT AND PLAN:  1.  Frequent palpitations: This is described as "different" from the SVT she experiences. She has not had to take a dose of diltiazem for this. I will check a BMET, TSH, and magnesium today. She will have a 2 week Zio monitor placed for frequency and morphology of her heart palpitations. They appear to be benign PVC's based upon her description but will be thorough and look at telemetry tracing.   2. SVT: No recurrence of this currently. No need to take  diltiazem doses.   3. Hyperlipidemia: Continue dietary restrictions. She is followed by her PCP for these labs annually.    Current medicines are reviewed at length with the patient today.    Labs/ tests ordered today include: BMET , TSH, Mg.  Phill Myron. West Pugh, ANP, AACC   04/04/2018 2:56 PM    [address] Office [phone] Fax [phone]

## 2018-04-04 NOTE — Telephone Encounter (Signed)
Patient was transferred from operator d/t concerns of palpitations. Patient reports over the weekend she has notice sporadic irregular heart heats. She reports it is a slow thud every now and then. She cannot correlate when the symptoms occur - can be worse when lying down or when she turns to a certain side of happen while driving or sitting in church. She does not feel like this is SVT. She called in 1 month ago for similar concerns & was scheduled to see MD 5 days later but cancelled this as she states she needs to be seen when the episodes are occurring. Scheduled her today to see NP @ 2pm.

## 2018-04-04 NOTE — Patient Instructions (Signed)
Medication Instructions:  NO CHANGES- Your physician recommends that you continue on your current medications as directed. Please refer to the Current Medication list given to you today. If you need a refill on your cardiac medications before your next appointment, please call your pharmacy.  Labwork: BMET,MAG AND TSH TODAY HERE IN OUR OFFICE AT [facility] Take the provided lab slips with you to the lab for your blood draw.  When you have your labs (blood work) drawn today and your tests are completely normal, you will receive your results only by MyChart Message (if you have MyChart) -OR-  A paper copy in the mail.  If you have any lab test that is abnormal or we need to change your treatment, we will call you to review these results.  Testing/Procedures: Your physician has recommended that you wear a 2 WEEK DAY ZIO-PATCH monitor. The Zio patch cardiac monitor continuously records heart rhythm data for up to 14 days, this is for patients being evaluated for multiple types heart rhythms. For the first 24 hours post application, please avoid getting the Zio monitor wet in the shower or by excessive sweating during exercise. After that, feel free to carry on with regular activities. Keep soaps and lotions away from the ZIO XT Patch.  This will be placed at our [facility] location - [address], [address].    Follow-Up: You will need a follow up appointment in 1 months.    You may see Skeet Latch, MD Jory Sims, DNP, AACC or one of the following Advanced Practice Providers on your designated Care Team:  Kerin Ransom, PA-C  Roby Lofts, PA-C Sande Rives, [place]      At [facility], you and your health needs are our priority.  As part of our continuing mission to provide you with exceptional heart care, we have created designated Provider Care Teams.  These Care Teams include your primary Cardiologist (physician) and Advanced Practice Providers (APPs -  Physician Assistants and Nurse  Practitioners) who all work together to provide you with the care you need, when you need it.  Thank you for choosing [facility] at [facility]!!

## 2018-04-05 LAB — BASIC METABOLIC PANEL
BUN/Creatinine Ratio: 12 (ref 9–23)
BUN: 11 mg/dL (ref 6–24)
CO2: 21 mmol/L (ref 20–29)
Calcium: 9 mg/dL (ref 8.7–10.2)
Chloride: 105 mmol/L (ref 96–106)
Creatinine, Ser: 0.89 mg/dL (ref 0.57–1.00)
GFR calc Af Amer: 87 mL/min/{1.73_m2} (ref >59)
GFR, EST NON AFRICAN AMERICAN: 76 mL/min/{1.73_m2} (ref >59)
GLUCOSE: 90 mg/dL (ref 65–99)
POTASSIUM: 4.5 mmol/L (ref 3.5–5.2)
SODIUM: 143 mmol/L (ref 134–144)

## 2018-04-05 LAB — TSH: TSH: 2.88 u[IU]/mL (ref 0.450–4.500)

## 2018-04-05 LAB — MAGNESIUM: MAGNESIUM: 2.2 mg/dL (ref 1.6–2.3)

## 2018-04-06 ENCOUNTER — Ambulatory Visit (INDEPENDENT_AMBULATORY_CARE_PROVIDER_SITE_OTHER): Payer: BLUE CROSS/BLUE SHIELD

## 2018-04-06 DIAGNOSIS — R002 Palpitations: Secondary | ICD-10-CM

## 2018-05-07 NOTE — Progress Notes (Signed)
Cardiology Office Note   Date:  05/09/2018   ID:  Saria, Haran 1965/05/22, MRN [id number]  PCP:  Day, Jacqlyn Krauss, MD  Cardiologist: Oval Linsey  Chief Complaint  Patient presents with  . Follow-up     History of Present Illness: Stephanie Trujillo is a 53 y.o. female who presents for ongoing assessment and management of PSVT, on diltiazem prn. Other history includes HTN and HL On last office 04/04/2018 visit she complained of "hard palpitations" which came in groups lasting 10 minutes or more.   A 2 week Zio monitor was placed and labs were drawn to include TSH and BMET. Her labs were normal.   Study Highlights   14 Day Event Monitor  Quality: Fair.  Baseline artifact. Predominant rhythm: sinus rhythm Average heart rate: 84 bpm Max heart rate: 162 bpm Min heart rate: 54 bpm  One episode of SVT (9 beats, max rate 162 bpm)   It was noted on my review that she did have occasional PAC's as well. She triggered the monitor when these occurred. She has multiple questions.    Past Medical History:  Diagnosis Date  . Abnormal Pap smear    laser surgery '88  . Anemia 02/2008  . Bacterial vaginosis 2005  . Condylomata acuminata   . Ecchymosis 2011  . Endometrial polyp 08/02/03  . FH: diabetes mellitus   . FH: hypertension   . FH: lupus   . FH: rheumatoid arthritis   . FH: stroke   . FH: thyroid disease   . Fibroid 2009  . H/O candidiasis   . H/O cervicitis 2002  . H/O dysmenorrhea 2005  . H/O vaginal bleeding 04/2010  . H/O varicella   . H/O: menorrhagia 08/02/2003  . Hair loss 2008  . 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 Hx of irritable bowel syndrome    Not problems in years   . Hx of ovarian cyst 2008  . Irregular menses   . Mastodynia, female 08/2010   transient   . Pelvic pain in female 2012   transient  . 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 SVT (supraventricular tachycardia) (HCC)    tx diltiazem prn  . Vulvar lesion 09/2007  . Weight loss 07/2006    Past Surgical History:    Procedure Laterality Date  . ABDOMINOPLASTY    . ABLATION  2010  . BREAST CYST ASPIRATION    . CESAREAN SECTION  1995  . DILATATION & CURRETTAGE/HYSTEROSCOPY WITH RESECTOCOPE N/A 09/17/2017   Procedure: DILATATION & CURETTAGE/HYSTEROSCOPY WITH RESECTOCOPE;  Surgeon: Eldred Manges, MD;  Location: [facility];  Service: Gynecology;  Laterality: N/A;  . HERNIA REPAIR    . OVARIAN CYST REMOVAL  2001  . WISDOM TOOTH EXTRACTION  1990     Current Outpatient Medications  Medication Sig Dispense Refill  . clobetasol ointment (TEMOVATE) 0.73 % Apply 1 application topically 2 (two) times daily as needed (for eczema).    . diltiazem (CARDIZEM) 30 MG tablet Take 1 tablet (30 mg total) by mouth every 6 (six) hours as needed (palpitation). (Patient taking differently: Take 30 mg by mouth every 6 (six) hours as needed (for SVT). ) 20 tablet 11  . fluocinonide cream (LIDEX) 7.10 % Apply 1 application topically 2 (two) times daily as needed (eczema).     . triamcinolone cream (KENALOG) 0.1 % Apply 1 application topically 2 (two) times daily as needed (itching/eczema).     . valACYclovir (VALTREX) 500 MG tablet Take 500-1,000 mg by mouth daily  as needed (for outbreaks/shingles).      No current facility-administered medications for this visit.     Allergies:   Codeine    Social History:  The patient  reports that she has never smoked. She has never used smokeless tobacco. She reports current alcohol use. She reports that she does not use drugs.   Family History:  The patient's family history includes Arrhythmia in her father; Breast cancer in her cousin and cousin; Diabetes in her paternal grandmother; Hypertension in her father; Lupus in her mother; Rheum arthritis in her maternal aunt and sister.    ROS: All other systems are reviewed and negative. Unless otherwise mentioned in H&P    PHYSICAL EXAM: VS:  BP 100/66   Pulse 85   Ht 5\' 3"  (1.6 m)   Wt 167 lb 6.4 oz (75.9 kg)   BMI 29.65 kg/m  ,  BMI Body mass index is 29.65 kg/m. GEN: Well nourished, well developed, in no acute distress HEENT: normal Neck: no JVD, carotid bruits, or masses Cardiac:RRR; no murmurs, rubs, or gallops,no edema  Respiratory:  Clear to auscultation bilaterally, normal work of breathing GI: soft, nontender, nondistended, + BS MS: no deformity or atrophy Skin: warm and dry, no rash Neuro:  Strength and sensation are intact Psych: euthymic mood, full affect   EKG: Not completed this office visit.   Recent Labs: 09/17/2017: Hemoglobin 13.0; Platelets 288 04/04/2018: BUN 11; Creatinine, Ser 0.89; Magnesium 2.2; Potassium 4.5; Sodium 143; TSH 2.880    Lipid Panel No results found for: CHOL, TRIG, HDL, CHOLHDL, VLDL, LDLCALC, LDLDIRECT    Wt Readings from Last 3 Encounters:  05/09/18 167 lb 6.4 oz (75.9 kg)  04/04/18 166 lb 12.8 oz (75.7 kg)  11/28/17 156 lb 6.4 oz (70.9 kg)      Other studies Reviewed: Echocardiogram 2014-09-13 Left ventricle: The cavity size was normal. Wall thickness was   normal. Systolic function was normal. The estimated ejection   fraction was in the range of 60% to 65%. Wall motion was normal;   there were no regional wall motion abnormalities. Left   ventricular diastolic function parameters were normal. - Left atrium: The atrium was normal in size.  Impressions:  - Normal study  ASSESSMENT AND PLAN:  1. Palpitations: Hx of PSVT. Zio monitor was completed with one event of elevated HR which occurred while she was exercising. She noted to have PAC's and a rare PVC. I have explained her results to her and given her a copy. I have given her reassurance that the PAC's and PVC's are benign. She is to continue her current regimen as directed.   2. Hypertension:  BP is currently currently well controlled. No changes in her medications.   Current medicines are reviewed at length with the patient today.    Labs/ tests ordered today include: None  Phill Myron. West Pugh, ANP, [facility]   05/09/2018 10:54 AM    [facility] [place] [address] Office [phone] Fax [phone]

## 2018-05-09 ENCOUNTER — Encounter: Payer: Self-pay | Admitting: Adult Health

## 2018-05-09 ENCOUNTER — Ambulatory Visit: Payer: BLUE CROSS/BLUE SHIELD | Admitting: Adult Health

## 2018-05-09 VITALS — BP 100/66 | HR 85 | Ht 63.0 in | Wt 167.4 lb

## 2018-05-09 DIAGNOSIS — I491 Atrial premature depolarization: Secondary | ICD-10-CM

## 2018-05-09 DIAGNOSIS — I471 Supraventricular tachycardia: Secondary | ICD-10-CM

## 2018-05-09 NOTE — Patient Instructions (Signed)
Follow-Up: You will need a follow up appointment in 6 months.  Please call our office 2 months  (June 2020) in advance to schedule this (AUGUST 2020)  appointment.  You may see Skeet Latch, MD Jory Sims, DNP, AACC or one of the following Advanced Practice Providers on your designated Care Team:  Kerin Ransom, [place]  Roby Lofts, PA-C  Sande Rives, [place]       Medication Instructions:  NO CHANGES- Your physician recommends that you continue on your current medications as directed. Please refer to the Current Medication list given to you today. If you need a refill on your cardiac medications before your next appointment, please call your pharmacy. Labwork: When you have labs (blood work) and your tests are completely normal, you will receive your results ONLY by [facility] (if you have MyChart) -OR- A paper copy in the mail.  At [facility], you and your health needs are our priority.  As part of our continuing mission to provide you with exceptional heart care, we have created designated Provider Care Teams.  These Care Teams include your primary Cardiologist (physician) and Advanced Practice Providers (APPs -  Physician Assistants and Nurse Practitioners) who all work together to provide you with the care you need, when you need it.  Thank you for choosing [facility] at [facility]!!

## 2019-01-17 ENCOUNTER — Other Ambulatory Visit: Payer: Self-pay | Admitting: Obstetrics and Gynecology

## 2019-01-17 ENCOUNTER — Other Ambulatory Visit: Payer: Self-pay | Admitting: Internal Medicine

## 2019-01-17 DIAGNOSIS — Z1231 Encounter for screening mammogram for malignant neoplasm of breast: Secondary | ICD-10-CM

## 2019-02-06 ENCOUNTER — Telehealth: Payer: Self-pay | Admitting: *Deleted

## 2019-02-06 NOTE — Telephone Encounter (Signed)
Patient scheduled for virtual visit with Dr Oval Linsey tomorrow. It is scheduled as an overbook and will need to be moved, ok to see PA/NP  Left message to call back

## 2019-02-07 ENCOUNTER — Encounter: Payer: Self-pay | Admitting: Cardiovascular Disease

## 2019-02-07 ENCOUNTER — Telehealth (INDEPENDENT_AMBULATORY_CARE_PROVIDER_SITE_OTHER): Payer: BC Managed Care – PPO | Admitting: Cardiovascular Disease

## 2019-02-07 VITALS — Ht 63.0 in

## 2019-02-07 DIAGNOSIS — I471 Supraventricular tachycardia: Secondary | ICD-10-CM | POA: Diagnosis not present

## 2019-02-07 DIAGNOSIS — E663 Overweight: Secondary | ICD-10-CM | POA: Diagnosis not present

## 2019-02-07 DIAGNOSIS — E7800 Pure hypercholesterolemia, unspecified: Secondary | ICD-10-CM | POA: Diagnosis not present

## 2019-02-07 NOTE — Progress Notes (Signed)
Cardiology Office Note   Date:  02/09/2019   ID:  BUFORD CATER, DOB 1966/02/21, MRN [id number]  PCP:  Trujillo, Stephanie Krauss, MD  Cardiologist:   Stephanie Latch, MD   No chief complaint on file.   History of Present Illness: Stephanie Trujillo is a 53 y.o. female with SVT and uterine fibroids who presents for follow-up.  She previously saw Dr. Ellyn Trujillo after an emergency room visit for SVT.  She had an episode in 2016 that was treated with adenosine.  She was instructed to start diltiazem but never used it because it was as needed.  At that time she saw Dr. Bronson Trujillo and had an echo that revealed LVEF 60-65% with normal diastolic function.  She was seen in the hospital 06/22/16 with SVT.  By the time she arrived her heart rate was slowing and it spontaneously broke.  She followed up with Dr. Ellyn Trujillo and had not had any recurrent episodes.  She had occasional episodes of fluttering.  She was given another prescription for diltiazem as needed. She has not needed to take this.  She has occasional heart fluttering and feels as though she is about to go into SVT.  However she has been able to control it with vagal maneuvers.  She also notes occasional heart palpitations that last for a second or 2.  She has episodes of lightheadedness and dizziness but not necessarily at the same time as when she is having palpitations.  Stephanie Trujillo is a patient of Dr. Kendall Trujillo.  In visiting with Dr. Leo Trujillo and other physicians she noted that her blood pressure  started to increase. At her last appointment she brought a log showing that her BP was controlled at home.  Since her last appointment she has been very stressed due to the coronavirus.  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  She has been stress eating and gaining weight.  She also recently purchased a new house and moved.  She hasn't checked her BP much since the move in March, but prior to August her BP was mostly  <130/80.  Overall she has been well physically.  She occasionally has substernal burning and tightening of her chest.  It happens at rest and not exertion, though she hasn't been very physically active lately due to her work schedule.  She hasn't experienced any SVT but has rare episodes of fleeting palpitations.    Past Medical History:  Diagnosis Date   Abnormal Pap smear    laser surgery '88   Anemia 02/2008   Bacterial vaginosis 2005   Condylomata acuminata    Ecchymosis 2011   Endometrial polyp 08/02/03   FH: diabetes mellitus    FH: hypertension    FH: lupus    FH: rheumatoid arthritis    FH: stroke    FH: thyroid disease    Fibroid 2009   H/O candidiasis    H/O cervicitis 2002   H/O dysmenorrhea 2005   H/O vaginal bleeding 04/2010   H/O varicella    H/O: menorrhagia 08/02/2003   Hair loss 2008   HPV in female 1988   Hx of irritable bowel syndrome    Not problems in years    Hx of ovarian cyst 2008   Irregular menses    Mastodynia, female 08/2010   transient    Pelvic pain in female 2012   transient   Purulent vaginitis 02/2004   SVT (supraventricular tachycardia) ([place])  tx diltiazem prn   Vulvar lesion 09/2007   Weight loss 07/2006    Past Surgical History:  Procedure Laterality Date   ABDOMINOPLASTY     ABLATION  2010   BREAST CYST ASPIRATION     CESAREAN SECTION  1995   DILATATION & CURRETTAGE/HYSTEROSCOPY WITH RESECTOCOPE N/A 09/17/2017   Procedure: DILATATION & CURETTAGE/HYSTEROSCOPY WITH RESECTOCOPE;  Surgeon: Eldred Manges, MD;  Location: [facility];  Service: Gynecology;  Laterality: N/A;   HERNIA REPAIR     OVARIAN CYST REMOVAL  2001   WISDOM TOOTH EXTRACTION  1990     Current Outpatient Medications  Medication Sig Dispense Refill   clobetasol ointment (TEMOVATE) [id number] % Apply 1 application topically 2 (two) times daily as needed (for eczema).     fluocinonide cream (LIDEX) AB-123456789 % Apply 1 application topically  2 (two) times daily as needed (eczema).      triamcinolone cream (KENALOG) 0.1 % Apply 1 application topically 2 (two) times daily as needed (itching/eczema).      valACYclovir (VALTREX) 500 MG tablet Take 500-1,000 mg by mouth daily as needed (for outbreaks/shingles).      diltiazem (CARDIZEM) 30 MG tablet Take 1 tablet (30 mg total) by mouth every 6 (six) hours as needed (palpitation). (Patient not taking: Reported on 02/07/2019) 20 tablet 11   No current facility-administered medications for this visit.     Allergies:   Codeine    Social History:  The patient  reports that she has never smoked. She has never used smokeless tobacco. She reports current alcohol use. She reports that she does not use drugs.   Family History:  The patient's family history includes Arrhythmia in her father; Breast cancer in her cousin and cousin; Diabetes in her paternal grandmother; Hypertension in her father; Lupus in her mother; Rheum arthritis in her maternal aunt and sister.    ROS:  Please see the history of present illness.   Otherwise, review of systems are positive for none.   All other systems are reviewed and negative.    PHYSICAL EXAM: VS:  Ht 5\' 3"  (1.6 m)    BMI 29.65 kg/m  , BMI Body mass index is 29.65 kg/m. GENERAL:  Well appearing.  No acute distress HEENT: Pupils equal round and reactive, fundi not visualized, oral mucosa unremarkable NECK:  No jugular venous distention LUNGS:  Respirations unlabored SKIN:  No rashes no nodules NEURO:  Cranial nerves II through XII grossly intact, motor grossly intact throughout PSYCH:  Cognitively intact, oriented to person place and time  EKG:  EKG is not ordered today. The ekg ordered 04/29/17 demonstrates sinus rhythm.  Rate 69 bpm.   10/21/17:  sinus rhythm.  Rate 71 bpm.  Echo 08/23/14: Study Conclusions  - Left ventricle: The cavity size was normal. Wall thickness was   normal. Systolic function was normal. The estimated ejection    fraction was in the range of 60% to 65%. Wall motion was normal;   there were no regional wall motion abnormalities. Left   ventricular diastolic function parameters were normal. - Left atrium: The atrium was normal in size.  Impressions:  - Normal study.  ETT 08/28/14: Normal  14 Trujillo Event Monitor 03/2018: Quality: Fair.  Baseline artifact. Predominant rhythm: sinus rhythm Average heart rate: 84 bpm Max heart rate: 162 bpm Min heart rate: 54 bpm  One episode of SVT (9 beats, max rate 162 bpm)   Recent Labs: 04/04/2018: BUN 11; Creatinine, Ser 0.89; Magnesium 2.2; Potassium 4.5;  Sodium 143; TSH 2.880   06/18/16: Total cholesterol 208, triglycerides 89, HDL 60, LDL 121  Lipid Panel No results found for: CHOL, TRIG, HDL, CHOLHDL, VLDL, LDLCALC, LDLDIRECT    Wt Readings from Last 3 Encounters:  05/09/18 167 lb 6.4 oz (75.9 kg)  04/04/18 166 lb 12.8 oz (75.7 kg)  11/28/17 156 lb 6.4 oz (70.9 kg)      ASSESSMENT AND PLAN:  # SVT: Well-controlled.  Continue prn diltiazem.  She has not needed any diltiazem latey.  # Elevated blood pressure: BP well-controlled at home.  She can't find her BP cuff today.  Encouraged to find time for herself for exercise and to choose one dietary intervention..  # Hyperlipidemia: LDL was 121.  However her ASCVD 10-year risk is 1%.  Therefore she is not on aspirin or statin.  She was encouraged to start back exercising regularly and to limit her use of fried foods, fatty foods, and cheeses.    Current medicines are reviewed at length with the patient today.  The patient does not have concerns regarding medicines.  The following changes have been made:  no change  Labs/ tests ordered today include: none No orders of the defined types were placed in this encounter.    Disposition:   FU with Lyberti Thrush C. Oval Linsey, MD, [facility] in 3 months     Signed, Layna Roeper C. Oval Linsey, MD, [facility]  02/09/2019 9:34 PM    [place]

## 2019-02-07 NOTE — Patient Instructions (Signed)
Medication Instructions:  Your physician recommends that you continue on your current medications as directed. Please refer to the Current Medication list given to you today.  *If you need a refill on your cardiac medications before your next appointment, please call your pharmacy*  Lab Work: NONE  Testing/Procedures: NONE  Follow-Up: At [facility], you and your health needs are our priority.  As part of our continuing mission to provide you with exceptional heart care, we have created designated Provider Care Teams.  These Care Teams include your primary Cardiologist (physician) and Advanced Practice Providers (APPs -  Physician Assistants and Nurse Practitioners) who all work together to provide you with the care you need, when you need it.  Your next appointment:   3 months  The format for your next appointment:   In Person  Provider:   You may see Skeet Latch, MD or one of the following Advanced Practice Providers on your designated Care Team:    Kerin Ransom, PA-C  [place], [place]  Coletta Memos, [place]   IF [place] OFFICE RIGHT ASAP

## 2019-02-07 NOTE — Telephone Encounter (Signed)
Patient had virtual visit today.

## 2019-03-02 ENCOUNTER — Other Ambulatory Visit: Payer: Self-pay

## 2019-03-02 ENCOUNTER — Ambulatory Visit
Admission: RE | Admit: 2019-03-02 | Discharge: 2019-03-02 | Disposition: A | Discharge status: Home / Self Care | Payer: BC Managed Care – PPO | Source: Ambulatory Visit | Attending: Internal Medicine | Admitting: Internal Medicine

## 2019-03-02 DIAGNOSIS — Z1231 Encounter for screening mammogram for malignant neoplasm of breast: Secondary | ICD-10-CM

## 2019-03-06 ENCOUNTER — Ambulatory Visit: Payer: BLUE CROSS/BLUE SHIELD

## 2019-10-10 ENCOUNTER — Other Ambulatory Visit: Payer: Self-pay | Admitting: Obstetrics and Gynecology

## 2019-10-10 DIAGNOSIS — D259 Leiomyoma of uterus, unspecified: Secondary | ICD-10-CM

## 2019-10-19 ENCOUNTER — Encounter: Payer: Self-pay | Admitting: *Deleted

## 2019-10-19 ENCOUNTER — Ambulatory Visit
Admission: RE | Admit: 2019-10-19 | Discharge: 2019-10-19 | Disposition: A | Discharge status: Home / Self Care | Payer: BC Managed Care – PPO | Source: Ambulatory Visit | Attending: Obstetrics and Gynecology | Admitting: Obstetrics and Gynecology

## 2019-10-19 ENCOUNTER — Other Ambulatory Visit: Payer: Self-pay

## 2019-10-19 DIAGNOSIS — D259 Leiomyoma of uterus, unspecified: Secondary | ICD-10-CM

## 2019-10-19 HISTORY — PX: IR RADIOLOGIST EVAL & MGMT: IMG5224

## 2019-10-19 NOTE — Consult Note (Signed)
Chief Complaint:  Uterine fibroid  Referring Physician(s): Roberts,Angela  History of Present Illness: Stephanie Trujillo is a 54 y.o. female G3, P3.  No future pregnancy plans.  Patient has a known large 10 cm anterior uterine fibroid.  She mainly has bulk related symptoms with urinary frequency, urgency, and decreased urine stream periodically.  This is progressed over the last 2 months.  No change in bowel habits or constipation.  She has gone through menopause and only occasionally has a hot flash.  She actually has not had a menstrual cycle since 2018.  No vaginal bleeding or spotting since then.  She currently is not on any birth control pills or hormone replacement therapies.  In 2019, she did have a hysteroscopic D&C and biopsy which was all benign.   No other recent fibroid interventions.  No recent GYN infection or abnormal discharge.  Also, back in 2018 she was evaluated for uterine fibroid embolization and did have a pelvic MRI which demonstrated a 10 cm dominant anterior intramural fibroid showing enhancement.  At that time, fibroid anatomy was amenable to embolization.  Past Medical History:  Diagnosis Date  . Abnormal Pap smear    laser surgery '88  . Anemia 02/2008  . Bacterial vaginosis 2005  . Condylomata acuminata   . Ecchymosis 2011  . Endometrial polyp 08/02/03  . FH: diabetes mellitus   . FH: hypertension   . FH: lupus   . FH: rheumatoid arthritis   . FH: stroke   . FH: thyroid disease   . Fibroid 2009  . H/O candidiasis   . H/O cervicitis 2002  . H/O dysmenorrhea 2005  . H/O vaginal bleeding 04/2010  . H/O varicella   . H/O: menorrhagia 08/02/2003  . Hair loss 2008  . 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 Hx of irritable bowel syndrome    Not problems in years   . Hx of ovarian cyst 2008  . Irregular menses   . Mastodynia, female 08/2010   transient   . Pelvic pain in female 2012   transient  . 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 SVT (supraventricular tachycardia)  ([facility])    tx diltiazem prn  . Vulvar lesion 09/2007  . Weight loss 07/2006    Past Surgical History:  Procedure Laterality Date  . ABDOMINOPLASTY    . ABLATION  2010  . BREAST CYST ASPIRATION    . CESAREAN SECTION  1995  . DILATATION & CURRETTAGE/HYSTEROSCOPY WITH RESECTOCOPE N/A 09/17/2017   Procedure: DILATATION & CURETTAGE/HYSTEROSCOPY WITH RESECTOCOPE;  Surgeon: Eldred Manges, MD;  Location: [facility];  Service: Gynecology;  Laterality: N/A;  . HERNIA REPAIR    . OVARIAN CYST REMOVAL  2001  . WISDOM TOOTH EXTRACTION  1990    Allergies: Codeine  Medications: Prior to Admission medications   Medication Sig Start Date End Date Taking? Authorizing Provider  clobetasol ointment (TEMOVATE) 4.78 % Apply 1 application topically 2 (two) times daily as needed (for eczema).    [provider]  diltiazem (CARDIZEM) 30 MG tablet Take 1 tablet (30 mg total) by mouth every 6 (six) hours as needed (palpitation). Patient not taking: Reported on 02/07/2019 07/15/16   Leonie Man, MD  fluocinonide cream (LIDEX) 2.95 % Apply 1 application topically 2 (two) times daily as needed (eczema).     [provider]  triamcinolone cream (KENALOG) 0.1 % Apply 1 application topically 2 (two) times daily as needed (itching/eczema).     [provider]  valACYclovir (  VALTREX) 500 MG tablet Take 500-1,000 mg by mouth daily as needed (for outbreaks/shingles).     [provider]     Family History  Problem Relation Age of Onset  . Lupus Mother   . Hypertension Father   . Arrhythmia Father   . Diabetes Paternal Grandmother   . Breast cancer Cousin   . Breast cancer Cousin   . Rheum arthritis Sister   . Rheum arthritis Maternal Aunt     Social History   Socioeconomic History  . Marital status: Married    Spouse name: Not on file  . Number of children: Not on file  . Years of education: Not on file  . Highest education level: Not on file  Occupational History    . Not on file  Tobacco Use  . Smoking status: Never Smoker  . Smokeless tobacco: Never Used  Vaping Use  . Vaping Use: Never used  Substance and Sexual Activity  . Alcohol use: Yes    Comment: occassionally wine  . Drug use: No  . Sexual activity: Yes    Birth control/protection: None    Comment: husband with vasectomy   Other Topics Concern  . Not on file  Social History Narrative  . Not on file   Social Determinants of Health   Financial Resource Strain:   . Difficulty of Paying Living Expenses:   Food Insecurity:   . Worried About Charity fundraiser in the Last Year:   . Arboriculturist in the Last Year:   Transportation Needs:   . Film/video editor (Medical):   Marland Kitchen Lack of Transportation (Non-Medical):   Physical Activity:   . Days of Exercise per Week:   . Minutes of Exercise per Session:   Stress:   . Feeling of Stress :   Social Connections:   . Frequency of Communication with Friends and Family:   . Frequency of Social Gatherings with Friends and Family:   . Attends Religious Services:   . Active Member of Clubs or Organizations:   . Attends Archivist Meetings:   Marland Kitchen Marital Status:      Review of Systems  Review of Systems: A 12 point ROS discussed and pertinent positives are indicated in the HPI above.  All other systems are negative.  Physical Exam No direct physical exam was performed, telephone health visit only today because of Covid pandemic Vital Signs: There were no vitals taken for this visit.  Imaging: No results found.  Labs:  CBC: No results for input(s): WBC, HGB, HCT, PLT in the last 8760 hours.  COAGS: No results for input(s): INR, APTT in the last 8760 hours.  BMP: No results for input(s): NA, K, CL, CO2, GLUCOSE, BUN, CALCIUM, CREATININE, GFRNONAA, GFRAA in the last 8760 hours.  Invalid input(s): CMP  LIVER FUNCTION TESTS: No results for input(s): BILITOT, AST, ALT, ALKPHOS, PROT, ALBUMIN in the last 8760  hours.   Assessment and Plan:  Symptomatic uterine fibroids, previously documented to have a dominant 10.5 cm anterior intramural fibroid in 2018.  Main symptoms are bulk related with urinary frequency, urgency and decreased stream.  This has progressed over the last 2 months.  No current abnormal menstrual bleeding as the patient has gone through menopause.  The uterine fibroid embolization procedure was reviewed in detail including the procedure, risk, benefits and alternatives.  She understands the procedure is done at [facility] and requires moderate sedation only as well as an overnight  recovery.  Typical recovery for fibroid embolization is 7 to 14 days.  All questions were addressed with the patient.  She has a clear understanding of the procedure.  She would like to proceed with a work-up which would include a updated pelvic MRI without and with contrast.  Plan: Scheduled for outpatient pelvic MRI without and with contrast to assess fibroid anatomy and compare to the 2018 exam to make sure the fibroids would be amenable to embolization therapy.  Thank you for this interesting consult.  I greatly enjoyed meeting Stephanie Trujillo and look forward to participating in their care.  A copy of this report was sent to the requesting provider on this date.  Electronically Signed: Greggory Keen 10/19/2019, 3:23 PM   I spent a total of  60 Minutes   in remote  clinical consultation, greater than 50% of which was counseling/coordinating care for .    Visit type: Audio only (telephone). Audio (no video) only due to patient's lack of internet/smartphone capability. Alternative for in-person consultation at [facility], [address], [place], [place]. This visit type was conducted due to national recommendations for restrictions regarding the COVID-19 Pandemic (e.g. social distancing).  This format is felt to be most appropriate for this patient at this time.  All issues noted in  this document were discussed and addressed.

## 2019-10-20 ENCOUNTER — Other Ambulatory Visit: Payer: Self-pay | Admitting: Interventional Radiology

## 2019-10-20 DIAGNOSIS — D259 Leiomyoma of uterus, unspecified: Secondary | ICD-10-CM

## 2020-01-29 ENCOUNTER — Other Ambulatory Visit: Payer: BC Managed Care – PPO

## 2020-02-08 ENCOUNTER — Other Ambulatory Visit: Payer: Self-pay | Admitting: Interventional Radiology

## 2020-02-08 DIAGNOSIS — D259 Leiomyoma of uterus, unspecified: Secondary | ICD-10-CM

## 2020-02-13 ENCOUNTER — Ambulatory Visit (HOSPITAL_COMMUNITY): Payer: BC Managed Care – PPO

## 2020-02-14 ENCOUNTER — Ambulatory Visit
Admission: RE | Admit: 2020-02-14 | Discharge: 2020-02-14 | Disposition: A | Discharge status: Home / Self Care | Payer: BC Managed Care – PPO | Source: Ambulatory Visit | Attending: Interventional Radiology | Admitting: Interventional Radiology

## 2020-02-14 ENCOUNTER — Other Ambulatory Visit: Payer: Self-pay

## 2020-02-14 DIAGNOSIS — D259 Leiomyoma of uterus, unspecified: Secondary | ICD-10-CM

## 2020-02-14 HISTORY — PX: IR RADIOLOGIST EVAL & MGMT: IMG5224

## 2020-02-14 NOTE — Progress Notes (Signed)
[name], female   DOB: 1965-11-14, 54 y.o.   MRN: [id number]       Chief Complaint:  Symptomatic uterine fibroids  Referring Physician(s): Dr. Mancel Bale  History of Present Illness: Stephanie Trujillo is a 54 y.o. female who is a G3, P3.  Again, patient has a known large 10 cm anterior solitary uterine fibroid.  She mainly describes bulk related symptoms with urinary frequency, urgency and decreased urine stream.  This continues to progress over the last year.  She also now reports change in bowel habits with constipation developing.  She has gone through menopause and still has an occasional hot flash.  She has not had a menstrual cycle or any vaginal bleeding since 2018.  Today, she also describes intermittent chronic pelvic pain and pressure into the hips.  She is concerned that this may be secondary to some other process in her pelvis or possibly related to the fibroid.  She is scheduled for pelvic MRI tomorrow morning at [facility].  Past Medical History:  Diagnosis Date  . Abnormal Pap smear    laser surgery '88  . Anemia 02/2008  . Bacterial vaginosis 2005  . Condylomata acuminata   . Ecchymosis 2011  . Endometrial polyp 08/02/03  . FH: diabetes mellitus   . FH: hypertension   . FH: lupus   . FH: rheumatoid arthritis   . FH: stroke   . FH: thyroid disease   . Fibroid 2009  . H/O candidiasis   . H/O cervicitis 2002  . H/O dysmenorrhea 2005  . H/O vaginal bleeding 04/2010  . H/O varicella   . H/O: menorrhagia 08/02/2003  . Hair loss 2008  . HPV in female 87  . Hx of irritable bowel syndrome    Not problems in years   . Hx of ovarian cyst 2008  . Irregular menses   . Mastodynia, female 08/2010   transient   . Pelvic pain in female 2012   transient  . 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 SVT (supraventricular tachycardia) ([facility])    tx diltiazem prn  . Vulvar lesion 09/2007  . Weight loss 07/2006    Past Surgical History:  Procedure Laterality Date  .  ABDOMINOPLASTY    . ABLATION  2010  . BREAST CYST ASPIRATION    . CESAREAN SECTION  1995  . DILATATION & CURRETTAGE/HYSTEROSCOPY WITH RESECTOCOPE N/A 09/17/2017   Procedure: DILATATION & CURETTAGE/HYSTEROSCOPY WITH RESECTOCOPE;  Surgeon: Eldred Manges, MD;  Location: [facility];  Service: Gynecology;  Laterality: N/A;  . HERNIA REPAIR    . IR RADIOLOGIST EVAL & MGMT  10/19/2019  . OVARIAN CYST REMOVAL  2001  . WISDOM TOOTH EXTRACTION  1990    Allergies: Codeine  Medications: Prior to Admission medications   Medication Sig Start Date End Date Taking? Authorizing Provider  clobetasol ointment (TEMOVATE) 4.70 % Apply 1 application topically 2 (two) times daily as needed (for eczema).    [provider]  diltiazem (CARDIZEM) 30 MG tablet Take 1 tablet (30 mg total) by mouth every 6 (six) hours as needed (palpitation). Patient not taking: Reported on 02/07/2019 07/15/16   Leonie Man, MD  fluocinonide cream (LIDEX) 9.62 % Apply 1 application topically 2 (two) times daily as needed (eczema).     [provider]  triamcinolone cream (KENALOG) 0.1 % Apply 1 application topically 2 (two) times daily as needed (itching/eczema).     [provider]  valACYclovir (VALTREX) 500 MG tablet Take  500-1,000 mg by mouth daily as needed (for outbreaks/shingles).     [provider]     Family History  Problem Relation Age of Onset  . Lupus Mother   . Hypertension Father   . Arrhythmia Father   . Diabetes Paternal Grandmother   . Breast cancer Cousin   . Breast cancer Cousin   . Rheum arthritis Sister   . Rheum arthritis Maternal Aunt     Social History   Socioeconomic History  . Marital status: Married    Spouse name: Not on file  . Number of children: Not on file  . Years of education: Not on file  . Highest education level: Not on file  Occupational History  . Not on file  Tobacco Use  . Smoking status: Never Smoker  . Smokeless tobacco: Never  Used  Vaping Use  . Vaping Use: Never used  Substance and Sexual Activity  . Alcohol use: Yes    Comment: occassionally wine  . Drug use: No  . Sexual activity: Yes    Birth control/protection: None    Comment: husband with vasectomy   Other Topics Concern  . Not on file  Social History Narrative  . Not on file   Social Determinants of Health   Financial Resource Strain:   . Difficulty of Paying Living Expenses: Not on file  Food Insecurity:   . Worried About Charity fundraiser in the Last Year: Not on file  . Ran Out of Food in the Last Year: Not on file  Transportation Needs:   . Lack of Transportation (Medical): Not on file  . Lack of Transportation (Non-Medical): Not on file  Physical Activity:   . Days of Exercise per Week: Not on file  . Minutes of Exercise per Session: Not on file  Stress:   . Feeling of Stress : Not on file  Social Connections:   . Frequency of Communication with Friends and Family: Not on file  . Frequency of Social Gatherings with Friends and Family: Not on file  . Attends Religious Services: Not on file  . Active Member of Clubs or Organizations: Not on file  . Attends Archivist Meetings: Not on file  . Marital Status: Not on file     Review of Systems  Review of Systems: A 12 point ROS discussed and pertinent positives are indicated in the HPI above.  All other systems are negative.  Physical Exam No direct physical exam was performed, telephone health visit only today because of Covid pandemic Vital Signs: There were no vitals taken for this visit.  Imaging: No results found.  Labs:  CBC: No results for input(s): WBC, HGB, HCT, PLT in the last 8760 hours.  COAGS: No results for input(s): INR, APTT in the last 8760 hours.  BMP: No results for input(s): NA, K, CL, CO2, GLUCOSE, BUN, CALCIUM, CREATININE, GFRNONAA, GFRAA in the last 8760 hours.  Invalid input(s): CMP  LIVER FUNCTION TESTS: No results for input(s):  BILITOT, AST, ALT, ALKPHOS, PROT, ALBUMIN in the last 8760 hours.   Assessment and Plan:  Symptomatic uterine fibroids, previously documented at 10 cm by dominant anterior intramural fibroid in 2018.  Main symptoms include bulk related symptoms related to urinary frequency, urgency and pressure.  She also reports decreased urinary stream.  In addition, she is developing pelvic and hip pain possibly related to the fibroid uterus.  She states she can feel the fibroid uterus below her bellybutton.  She also  is developing other bulk related symptoms with constipation.  Plan: Review pelvic MRI tomorrow to assess fibroid anatomy for embolization therapy.  I plan to call her tomorrow once the MRI scan has been completed and read.  Electronically Signed: Greggory Keen 02/14/2020, 3:36 PM   I spent a total of    25 Minutes in remote  clinical consultation, greater than 50% of which was counseling/coordinating care for this patient with uterine fibroids.    Visit type: Audio only (telephone). Audio (no video) only due to patient's lack of internet/smartphone capability. Alternative for in-person consultation at [facility], [address], [place], [place]. This visit type was conducted due to national recommendations for restrictions regarding the COVID-19 Pandemic (e.g. social distancing).  This format is felt to be most appropriate for this patient at this time.  All issues noted in this document were discussed and addressed.

## 2020-02-15 ENCOUNTER — Other Ambulatory Visit: Payer: Self-pay

## 2020-02-15 ENCOUNTER — Ambulatory Visit (HOSPITAL_COMMUNITY)
Admission: RE | Admit: 2020-02-15 | Discharge: 2020-02-15 | Disposition: A | Discharge status: Home / Self Care | Payer: BC Managed Care – PPO | Source: Ambulatory Visit | Attending: Interventional Radiology | Admitting: Interventional Radiology

## 2020-02-15 ENCOUNTER — Encounter: Payer: Self-pay | Admitting: *Deleted

## 2020-02-15 DIAGNOSIS — D259 Leiomyoma of uterus, unspecified: Secondary | ICD-10-CM | POA: Diagnosis present

## 2020-02-15 MED ORDER — GADOBUTROL 1 MMOL/ML IV SOLN
7.0000 mL | Freq: Once | INTRAVENOUS | Status: AC | PRN
  Administered 2020-02-15: 7 mL via INTRAVENOUS

## 2020-04-30 ENCOUNTER — Ambulatory Visit: Payer: BC Managed Care – PPO

## 2020-05-07 ENCOUNTER — Ambulatory Visit: Payer: BC Managed Care – PPO | Attending: Internal Medicine

## 2020-05-07 ENCOUNTER — Other Ambulatory Visit (HOSPITAL_COMMUNITY): Payer: Self-pay | Admitting: Internal Medicine

## 2020-05-07 DIAGNOSIS — Z23 Encounter for immunization: Secondary | ICD-10-CM

## 2020-05-07 MED FILL — PFIZER-BIONT COVID-19 VAC-T: 30 | 21 days supply | Qty: 2 | Fill #0

## 2020-05-07 NOTE — Progress Notes (Signed)
   Covid-19 Vaccination Clinic  Name:  Stephanie Trujillo    MRN: [id number] DOB: 10-22-1965  05/07/2020  [name] was observed post Covid-19 immunization for 15 minutes without incident. She was provided with Vaccine Information Sheet and instruction to access the V-Safe system.   Vaccinated By: Hoover Brunette   [name] was instructed to call 911 with any severe reactions post vaccine: Marland Kitchen Difficulty breathing  . Swelling of face and throat  . A fast heartbeat  . A bad rash all over body  . Dizziness and weakness   Immunizations Administered    Name Date Dose VIS Date Route   PFIZER Comrnaty(Gray TOP) Covid-19 Vaccine 05/07/2020  2:06 PM 0.3 mL 03/07/2020 Intramuscular   Manufacturer: [employer]   Lot: [id number]   NDC: [phone]

## 2020-05-31 ENCOUNTER — Other Ambulatory Visit: Payer: Self-pay | Admitting: Obstetrics and Gynecology

## 2020-05-31 DIAGNOSIS — Z1231 Encounter for screening mammogram for malignant neoplasm of breast: Secondary | ICD-10-CM

## 2020-07-24 ENCOUNTER — Inpatient Hospital Stay: Admission: RE | Admit: 2020-07-24 | Payer: BC Managed Care – PPO | Source: Ambulatory Visit

## 2020-09-10 ENCOUNTER — Ambulatory Visit: Payer: BC Managed Care – PPO

## 2020-09-11 ENCOUNTER — Ambulatory Visit
Admission: RE | Admit: 2020-09-11 | Discharge: 2020-09-11 | Disposition: A | Discharge status: Home / Self Care | Payer: BC Managed Care – PPO | Source: Ambulatory Visit | Attending: Obstetrics and Gynecology | Admitting: Obstetrics and Gynecology

## 2020-09-11 ENCOUNTER — Other Ambulatory Visit: Payer: Self-pay

## 2020-09-11 DIAGNOSIS — Z1231 Encounter for screening mammogram for malignant neoplasm of breast: Secondary | ICD-10-CM

## 2020-10-30 ENCOUNTER — Telehealth: Payer: Self-pay | Admitting: Cardiovascular Disease

## 2020-10-30 NOTE — Telephone Encounter (Signed)
Patient c/o Palpitations:  High priority if patient c/o lightheadedness, shortness of breath, or chest pain  How long have you had palpitations/irregular HR/ Afib? Are you having the symptoms now? Last couple of days;   Are you currently experiencing lightheadedness, SOB or CP? Chest pains  Do you have a history of afib (atrial fibrillation) or irregular heart rhythm? No   Have you checked your BP or HR? (document readings if available): 138/96; 129/91; 131/92  Are you experiencing any other symptoms? Patient is having trouble with left side of hip and groin and lower ba. Hard to walk. Nothing helps with pain

## 2020-10-30 NOTE — Telephone Encounter (Signed)
Spoke with pt, she discovered her bp was running high when she went to her eye doctor on July 18th and she reports it has been running high since then. She also having some increase palpitations and occasional chest pain. [place] and was calling to get a follow up appointment. She reports that she has not been eating any extra salt or anything she is being very careful. Follow up scheduled when patient returns.

## 2020-11-01 ENCOUNTER — Encounter: Payer: Self-pay | Admitting: Cardiovascular Disease

## 2020-11-01 NOTE — Telephone Encounter (Signed)
Error

## 2020-11-01 NOTE — Telephone Encounter (Signed)
Pt c/o BP issue: STAT if pt c/o blurred vision, one-sided weakness or slurred speech  1. What are your last 5 BP readings?  10/31/20 136/98   2. Are you having any other symptoms (ex. Dizziness, headache, blurred vision, passed out)? Tingling in left leg and arm, pulling in left groin, with pain in left side of hip   3. What is your BP issue?    Pt called in requesting a sooner appt due to flying back in town yesterday. Advised her there is no sooner appointment at this time, but she was added to the wait list. Reports no CP or palpitations. Getting ready to have MRI at 1:45 PM today. Has not checked BP yet for today.

## 2020-11-01 NOTE — Telephone Encounter (Signed)
Left a message for the patient to call back. She has an appointment on 8/9.

## 2020-11-03 NOTE — Progress Notes (Addendum)
Cardiology Office Note:    Date:  11/05/2020   ID:  Stephanie Trujillo, DOB 1965-12-14, MRN [id number]  PCP:  Trujillo, Stephanie Krauss, MD   [facility] Cardiologist:  Stephanie Latch, MD      Referring MD: Trujillo, Stephanie Krauss, MD   Follow-up for [name] and chest pain.  History of Present Illness:    Stephanie Trujillo is a 55 y.o. female with a hx of SVT and uterine fibroids.  She was seen by Dr. Ellyn Trujillo in the emergency department for SVT.  She was noted to have an episode of SVT in 2016 that was treated with adenosine.  At that time she was instructed to start on diltiazem.  However, she did not start medication because it was not needed.  She was seen by Dr. Bronson Trujillo who ordered an echocardiogram.  It showed LVEF of 60-65% with normal diastolic function.  She was seen in the hospital 3/18 with SVT.  When she arrived at the hospital her heart rate had slowed and it spontaneously converted to sinus rhythm.  She followed up with Dr. Ellyn Trujillo and did not report any recurrent episodes.  She did report the occasional episode of fluttering.  At that time she was given a prescription for diltiazem as needed.  She did report occasional heart fluttering with a feeling that her heart may go into SVT.  She was able to control her palpitations with vagal maneuvers.  She did report lightheadedness and dizziness but not simultaneously when having palpitations.  She was seen by Dr. Oval Trujillo 02/07/2019.  During that time she reported that she had been to see Dr. Leo Trujillo.  She reported her blood pressure started to increase.She brought normal blood pressure log showing her blood pressure is well controlled.  She reported that she had increased stress related to the coronavirus.  She was teaching both in person and virtually.  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  She had been stress eating and gaining weight.  She also had purchased a new home.  Her blood pressures were in the 130s over 80s.  She occasionally  noticed substernal burning and tightening of her chest.  The symptoms happen at rest but not with exertion.  She has not been very active due to her work schedule.  She reported only rare episodes of palpitations and denied episodes of SVT.  Contacted the nurse triage line on 11/01/2020 and reported that her blood pressure was in the 130/98 range.  She noticed tingling in her left leg and arm.  She denied chest pain and palpitations.  She presents to the clinic today for follow-up evaluation states she has noticed over the past several weeks her blood pressure has been elevated.  She noted that her blood pressure was elevated prior to her more recent left leg pain and lower back pain.  She reports that she is very busy and has dietary indiscretion.  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  She we will have them forward [place] their exam findings.  We reviewed her medication list and she is not actively taking meloxicam or other NSAID type medications.  She avoids caffeine and other stimulants.  She reports that over the COVID-19 pandemic she has gained about 20 pounds.  She has been limiting her physical activity due to her leg pain.  She is planning to have a nerve block and steroid injection next week for her  nerve impingement.  We reviewed the importance of increasing her physical activity, the benefits of weight reduction with blood pressure, low-sodium diet, and maintaining a blood pressure log.  I have asked her to start HCTZ 12.5 mg daily if her blood pressure continues to be elevated ([id number] diastolic).  We also discussed the importance of ordering a BMP if she does start HCTZ.  I will have her follow-up in 1 to 2 months.  11/05/20-She sent a message via MyChart indicating that her blood pressure was [id number] systolic after her appointment on 11/05/2020.  (Taken on her right arm which is noted to be higher left) I have instructed her to maintain a  blood pressure log and bring her blood pressure cuff with her for her next follow-up appointment.  Today she denies chest pain, shortness of breath, lower extremity edema, fatigue, palpitations, melena, hematuria, hemoptysis, diaphoresis, weakness, presyncope, syncope, orthopnea, and PND.   Past Medical History:  Diagnosis Date   Abnormal Pap smear    laser surgery '88   Anemia 02/2008   Bacterial vaginosis 2005   Condylomata acuminata    Ecchymosis 2011   Endometrial polyp 08/02/03   FH: diabetes mellitus    FH: hypertension    FH: lupus    FH: rheumatoid arthritis    FH: stroke    FH: thyroid disease    Fibroid 2009   H/O candidiasis    H/O cervicitis 2002   H/O dysmenorrhea 2005   H/O vaginal bleeding 04/2010   H/O varicella    H/O: menorrhagia 08/02/2003   Hair loss 2008   HPV in female 61   Hx of irritable bowel syndrome    Not problems in years    Hx of ovarian cyst 2008   Irregular menses    Mastodynia, female 08/2010   transient    Pelvic pain in female 2012   transient   Purulent vaginitis 02/2004   SVT (supraventricular tachycardia) ([place])    tx diltiazem prn   Vulvar lesion 09/2007   Weight loss 07/2006    Past Surgical History:  Procedure Laterality Date   ABDOMINOPLASTY     ABLATION  2010   BREAST CYST ASPIRATION     CESAREAN SECTION  1995   DILATATION & CURRETTAGE/HYSTEROSCOPY WITH RESECTOCOPE N/A 09/17/2017   Procedure: DILATATION & CURETTAGE/HYSTEROSCOPY WITH RESECTOCOPE;  Surgeon: Eldred Manges, MD;  Location: [facility];  Service: Gynecology;  Laterality: N/A;   HERNIA REPAIR     IR RADIOLOGIST EVAL & MGMT  10/19/2019   IR RADIOLOGIST EVAL & MGMT  02/14/2020   OVARIAN CYST REMOVAL  2001   WISDOM TOOTH EXTRACTION  1990    Current Medications: Current Meds  Medication Sig   clobetasol ointment (TEMOVATE) [id number] % Apply 1 application topically 2 (two) times daily as needed (for eczema).   COVID-19 mRNA Vac-TriS, Pfizer, SUSP injection INJECT AS  DIRECTED   diltiazem (CARDIZEM) 30 MG tablet Take 1 tablet (30 mg total) by mouth every 6 (six) hours as needed (palpitation).   fluocinonide cream (LIDEX) [id number] % Apply 1 application topically 2 (two) times daily as needed (eczema).    hydrochlorothiazide (MICROZIDE) 12.5 MG capsule Take 1 capsule (12.5 mg total) by mouth daily.   HYDROcodone-acetaminophen (NORCO/VICODIN) 5-325 MG tablet Take 1 tablet by mouth as needed for pain.   meloxicam (MOBIC) 7.5 MG tablet Take 1 tablet by mouth as needed for pain.   tiZANidine (ZANAFLEX) 4 MG tablet Take 4 mg by mouth 2 (two) times  daily.   triamcinolone cream (KENALOG) 0.1 % Apply 1 application topically 2 (two) times daily as needed (itching/eczema).    valACYclovir (VALTREX) 500 MG tablet Take 500-1,000 mg by mouth daily as needed (for outbreaks/shingles).      Allergies:   Codeine   Social History   Socioeconomic History   Marital status: Married    Spouse name: Not on file   Number of children: Not on file   Years of education: Not on file   Highest education level: Not on file  Occupational History   Not on file  Tobacco Use   Smoking status: Never   Smokeless tobacco: Never  Vaping Use   Vaping Use: Never used  Substance and Sexual Activity   Alcohol use: Yes    Comment: occassionally wine   Drug use: No   Sexual activity: Yes    Birth control/protection: None    Comment: husband with vasectomy   Other Topics Concern   Not on file  Social History Narrative   Not on file   Social Determinants of Health   Financial Resource Strain: Not on file  Food Insecurity: Not on file  Transportation Needs: Not on file  Physical Activity: Not on file  Stress: Not on file  Social Connections: Not on file     Family History: The patient's family history includes Arrhythmia in her father; Breast cancer in her cousin and cousin; Diabetes in her paternal grandmother; Hypertension in her father; Lupus in her mother; Rheum arthritis in  her maternal aunt and sister.  ROS:   Please see the history of present illness.      All other systems reviewed and are negative.   Risk Assessment/Calculations:           Physical Exam:    VS:  BP 110/74   Pulse 72   Ht 5' 2.5" (1.588 m)   Wt 164 lb 6.4 oz (74.6 kg)   SpO2 99%   BMI 29.59 kg/m     Wt Readings from Last 3 Encounters:  11/05/20 164 lb 6.4 oz (74.6 kg)  05/09/18 167 lb 6.4 oz (75.9 kg)  04/04/18 166 lb 12.8 oz (75.7 kg)     GEN:  Well nourished, well developed in no acute distress HEENT: Normal NECK: No JVD; No carotid bruits LYMPHATICS: No lymphadenopathy CARDIAC: RRR, no murmurs, rubs, gallops RESPIRATORY:  Clear to auscultation without rales, wheezing or rhonchi  ABDOMEN: Soft, non-tender, non-distended MUSCULOSKELETAL:  No edema; No deformity  SKIN: Warm and dry NEUROLOGIC:  Alert and oriented x 3 PSYCHIATRIC:  Normal affect    EKGs/Labs/Other Studies Reviewed:    The following studies were reviewed today:  Exercise Tolerance Test 08/28/2014 There was no ST segment deviation noted during stress. No T wave inversion was noted during stress.  Echo 08/23/2014 08/23/2014 Study Conclusions   - Left ventricle: The cavity size was normal. Wall thickness was    normal. Systolic function was normal. The estimated ejection    fraction was in the range of 60% to 65%. Wall motion was normal;    there were no regional wall motion abnormalities. Left    ventricular diastolic function parameters were normal.  - Left atrium: The atrium was normal in size.   Impressions:   - Normal study.   14 Tru[place] Event Monitor 03/2018:  Quality: Fair.  Baseline artifact. Predominant rhythm: sinus rhythm Average heart rate: 84 bpm Max heart rate: 162 bpm Min heart rate: 54 bpm   One episode  of SVT (9 beats, max rate 162 bpm)  EKG:  EKG is  ordered today.  The ekg ordered today demonstrates normal sinus rhythm 72 bpm  Recent Labs: No results found for  requested labs within last 8760 hours.  Recent Lipid Panel No results found for: CHOL, TRIG, HDL, CHOLHDL, VLDL, LDLCALC, LDLDIRECT  ASSESSMENT & PLAN   Supraventricular tachycardia-denies recent episodes. Avoiding triggers.  Wore cardiac event monitor 1/20.  Details above. Continue diltiazem as needed Vagal maneuvers Heart healthy low-sodium diet-salty 6 given Increase physical activity as tolerated Reviewed triggers caffeine, chocolate, ETOH, dehydration, Etc.  Hypertension-BP today 110/74.  Well-controlled at home. Start HCTZ 12.5 mg daily-if blood pressure remains elevated.  (BMP if starting medication) Heart healthy low-sodium diet-salty 6 given Increase physical activity as tolerated  Hypercholesterolemia-LDL 121.  Not on aspirin or statin due to low ASCVD score. Continue high-fiber diet-reviewed high-fiber options. Increase physical activity as tolerated   Disposition: Follow-up with Dr. Oval Trujillo or me in 1-2 months.       Medication Adjustments/Labs and Tests Ordered: Current medicines are reviewed at length with the patient today.  Concerns regarding medicines are outlined above.  Orders Placed This Encounter  Procedures   EKG 12-Lead    Meds ordered this encounter  Medications   hydrochlorothiazide (MICROZIDE) 12.5 MG capsule    Sig: Take 1 capsule (12.5 mg total) by mouth daily.    Dispense:  30 capsule    Refill:  3     Patient Instructions  Medication Instructions:   Stephanie Memos, FNP has sent in a prescription for Hydrochlorothiazide (HCTZ) 12.5 mg daily. Do not start this medication yet. Please see information below on measuring blood pressure and keeping a log.   If your blood pressure is running higher than 130/90, please call our office at [phone] so we can get some blood work and let you know when to start taking the HCTZ.   *If you need a refill on your cardiac medications before your next appointment, please call your  pharmacy*   Follow-Up: At [facility], you and your health needs are our priority.  As part of our continuing mission to provide you with exceptional heart care, we have created designated Provider Care Teams.  These Care Teams include your primary Cardiologist (physician) and Advanced Practice Providers (APPs -  Physician Assistants and Nurse Practitioners) who all work together to provide you with the care you need, when you need it.  We recommend signing up for the patient portal called "MyChart".  Sign up information is provided on this After Visit Summary.  MyChart is used to connect with patients for Virtual Visits (Telemedicine).  Patients are able to view lab/test results, encounter notes, upcoming appointments, etc.  Non-urgent messages can be sent to your provider as well.   To learn more about what you can do with MyChart, go to [email or web address].    Your next appointment:   1-2 month(s)  The format for your next appointment:   In Person  Provider:   Dr. Skeet Trujillo or Stephanie Memos, FNP   Other Instructions  Tips to Measure your Blood Pressure Correctly  To determine whether you have hypertension, a medical professional will take a blood pressure reading. How you prepare for the test, the position of your arm, and other factors can change a blood pressure reading by 10% or more. That could be enough to hide high blood pressure, start you on a drug you don't really need, or lead your doctor  to incorrectly adjust your medications.  National and international guidelines offer specific instructions for measuring blood pressure. If a doctor, nurse, or medical assistant isn't doing it right, don't hesitate to ask him or her to get with the guidelines.  Here's what you can do to ensure a correct reading:  Don't drink a caffeinated beverage or smoke during the 30 minutes before the test.  Sit quietly for five minutes before the test begins.  During the measurement, sit  in a chair with your feet on the floor and your arm supported so your elbow is at about heart level.  The inflatable part of the cuff should completely cover at least 80% of your upper arm, and the cuff should be placed on bare skin, not over a shirt.  Don't talk during the measurement.  Have your blood pressure measured twice, with a brief break in between. If the readings are different by 5 points or more, have it done a third time.  In 2017, new guidelines from the [place], the [employer] of Cardiology, and nine other health organizations lowered the diagnosis of high blood pressure to 130/80 mm Hg or higher for all adults. The guidelines also redefined the various blood pressure categories to now include normal, elevated, Stage 1 hypertension, Stage 2 hypertension, and hypertensive crisis (see "Blood pressure categories").  Blood pressure categories  Blood pressure category SYSTOLIC (upper number)  DIASTOLIC (lower number)  Normal Less than 120 mm Hg and Less than 80 mm Hg  Elevated 120-129 mm Hg and Less than 80 mm Hg  High blood pressure: Stage 1 hypertension 130-139 mm Hg or 80-89 mm Hg  High blood pressure: Stage 2 hypertension 140 mm Hg or higher or 90 mm Hg or higher  Hypertensive crisis (consult your doctor immediately) Higher than 180 mm Hg and/or Higher than 120 mm Hg  Source: American Heart Association and American Stroke Association. For more on getting your blood pressure under control, buy Controlling Your Blood Pressure, a Special Health Report from [facility].   Blood Pressure Log   Date   Time  Blood Pressure  Position  Example: Nov 1 9 AM 124/78 sitting                                                        Signed, Deberah Pelton, NP  11/05/2020 12:44 PM      Notice: This dictation was prepared with Dragon dictation along with smaller phrase technology. Any transcriptional errors that result from this  process are unintentional and may not be corrected upon review.  I spent 15 minutes examining this patient, reviewing medications, and using patient centered shared decision making involving her cardiac care.  Prior to her visit I spent greater than 20 minutes reviewing her past medical history,  medications, and prior cardiac tests.

## 2020-11-05 ENCOUNTER — Ambulatory Visit (HOSPITAL_BASED_OUTPATIENT_CLINIC_OR_DEPARTMENT_OTHER): Payer: BC Managed Care – PPO | Admitting: General Practice

## 2020-11-05 ENCOUNTER — Encounter (HOSPITAL_BASED_OUTPATIENT_CLINIC_OR_DEPARTMENT_OTHER): Payer: Self-pay

## 2020-11-05 ENCOUNTER — Telehealth: Payer: Self-pay | Admitting: General Practice

## 2020-11-05 ENCOUNTER — Other Ambulatory Visit: Payer: Self-pay

## 2020-11-05 ENCOUNTER — Encounter (HOSPITAL_BASED_OUTPATIENT_CLINIC_OR_DEPARTMENT_OTHER): Payer: Self-pay | Admitting: General Practice

## 2020-11-05 VITALS — BP 110/74 | HR 72 | Ht 62.5 in | Wt 164.4 lb

## 2020-11-05 DIAGNOSIS — I471 Supraventricular tachycardia, unspecified: Secondary | ICD-10-CM

## 2020-11-05 DIAGNOSIS — I10 Essential (primary) hypertension: Secondary | ICD-10-CM

## 2020-11-05 DIAGNOSIS — E7800 Pure hypercholesterolemia, unspecified: Secondary | ICD-10-CM

## 2020-11-05 MED ORDER — HYDROCHLOROTHIAZIDE 12.5 MG PO CAPS: 12.5000 mg | ORAL_CAPSULE | Freq: Every day | ORAL | 3 refills | Status: DC

## 2020-11-05 NOTE — Telephone Encounter (Signed)
Spoke with pt who states she had appointment with Annamaria Boots this morning.   Pt states she was advised to contact office if she obtained a BP greater than 130/90.  Pt states BP of her right arm at 12 noon was 139/93.  Attempted to educate pt on taking BP and readings as well as medication.  Pt has not started HCTZ.  Pt states again she is doing what she was told to do by contacting office with BP higher than 130/90.   Pt advised will forward message to Annamaria Boots for review and recommendation.  Pt verbalizes understanding and agrees with current plan.

## 2020-11-05 NOTE — Telephone Encounter (Signed)
Pt c/o BP issue: STAT if pt c/o blurred vision, one-sided weakness or slurred speech  1. What are your last 5 BP readings? On the right side 139/93  2. Are you having any other symptoms (ex. Dizziness, headache, blurred vision, passed out)? No   3. What is your BP issue? Patient just left the doctor, but patient states that it is reading different from when she was in the office.

## 2020-11-05 NOTE — Patient Instructions (Signed)
Medication Instructions:   Coletta Memos, FNP has sent in a prescription for Hydrochlorothiazide (HCTZ) 12.5 mg daily. Do not start this medication yet. Please see information below on measuring blood pressure and keeping a log.   If your blood pressure is running higher than 130/90, please call our office at [phone] so we can get some blood work and let you know when to start taking the HCTZ.   *If you need a refill on your cardiac medications before your next appointment, please call your pharmacy*   Follow-Up: At [facility], you and your health needs are our priority.  As part of our continuing mission to provide you with exceptional heart care, we have created designated Provider Care Teams.  These Care Teams include your primary Cardiologist (physician) and Advanced Practice Providers (APPs -  Physician Assistants and Nurse Practitioners) who all work together to provide you with the care you need, when you need it.  We recommend signing up for the patient portal called "MyChart".  Sign up information is provided on this After Visit Summary.  MyChart is used to connect with patients for Virtual Visits (Telemedicine).  Patients are able to view lab/test results, encounter notes, upcoming appointments, etc.  Non-urgent messages can be sent to your provider as well.   To learn more about what you can do with MyChart, go to [email or web address].    Your next appointment:   1-2 month(s)  The format for your next appointment:   In Person  Provider:   Dr. Skeet Latch or Coletta Memos, FNP   Other Instructions  Tips to Measure your Blood Pressure Correctly  To determine whether you have hypertension, a medical professional will take a blood pressure reading. How you prepare for the test, the position of your arm, and other factors can change a blood pressure reading by 10% or more. That could be enough to hide high blood pressure, start you on a drug you don't really  need, or lead your doctor to incorrectly adjust your medications.  National and international guidelines offer specific instructions for measuring blood pressure. If a doctor, nurse, or medical assistant isn't doing it right, don't hesitate to ask him or her to get with the guidelines.  Here's what you can do to ensure a correct reading:  Don't drink a caffeinated beverage or smoke during the 30 minutes before the test.  Sit quietly for five minutes before the test begins.  During the measurement, sit in a chair with your feet on the floor and your arm supported so your elbow is at about heart level.  The inflatable part of the cuff should completely cover at least 80% of your upper arm, and the cuff should be placed on bare skin, not over a shirt.  Don't talk during the measurement.  Have your blood pressure measured twice, with a brief break in between. If the readings are different by 5 points or more, have it done a third time.  In 2017, new guidelines from the [place], the [employer] of Cardiology, and nine other health organizations lowered the diagnosis of high blood pressure to 130/80 mm Hg or higher for all adults. The guidelines also redefined the various blood pressure categories to now include normal, elevated, Stage 1 hypertension, Stage 2 hypertension, and hypertensive crisis (see "Blood pressure categories").  Blood pressure categories  Blood pressure category SYSTOLIC (upper number)  DIASTOLIC (lower number)  Normal Less than 120 mm Hg and Less than 80 mm Hg  Elevated 120-129 mm Hg and Less than 80 mm Hg  High blood pressure: Stage 1 hypertension 130-139 mm Hg or 80-89 mm Hg  High blood pressure: Stage 2 hypertension 140 mm Hg or higher or 90 mm Hg or higher  Hypertensive crisis (consult your doctor immediately) Higher than 180 mm Hg and/or Higher than 120 mm Hg  Source: American Heart Association and American Stroke Association. For more on getting  your blood pressure under control, buy Controlling Your Blood Pressure, a Special Health Report from [facility].   Blood Pressure Log   Date   Time  Blood Pressure  Position  Example: Nov 1 9 AM 124/78 sitting

## 2020-11-14 ENCOUNTER — Encounter (HOSPITAL_BASED_OUTPATIENT_CLINIC_OR_DEPARTMENT_OTHER): Payer: Self-pay

## 2020-11-14 ENCOUNTER — Telehealth: Payer: Self-pay | Admitting: Cardiovascular Disease

## 2020-11-14 DIAGNOSIS — I10 Essential (primary) hypertension: Secondary | ICD-10-CM

## 2020-11-14 NOTE — Telephone Encounter (Signed)
Called patient, pt reports this morning she had a headache, took her blood pressure and found it was 145/94 on her right arm and 123/89 on her left. Pt reports at her visit with Coletta Memos on 8/9 she was advised to call if her blood pressure is over 130/90. Now, pt reports her headache is gone. She denies chest pain, shortness of breath, dizziness, facial droop, or weakness. Her blood pressure is now 142/95 on her right arm and 136/96 on her left. Pt also concerned that her blood pressure is different on one side than the other. Advised pt these readings would be forwarded to Dr. Oval Linsey and Coletta Memos for their review and advice. Advised pt if she experiences any chest pain, shortness of breath, dizziness, or any other concerning symptoms in the interim she should call 911 or be taken to the emergency room. Pt verbalized understanding, all questions/concerns addressed at this time.

## 2020-11-14 NOTE — Telephone Encounter (Signed)
Patient calling back in regards to her mychart message. She states she has cancelled the rest of her day due to her bad headache. She would like a response as soon as possible on whether she can come get labs. She states a mychart response or a call is fine.

## 2020-11-15 NOTE — Telephone Encounter (Signed)
Please contact Stephanie Trujillo and let her know that her blood pressure readings have been reviewed.  I would like her to start hydrochlorothiazide 12.5 mg daily.  We will have her come to the office or a [facility] location for a BMP to be drawn  1 week after starting the medication.  Please ask her to continue to eat a heart healthy low-sodium diet, increase her physical activity as tolerated, and keep a blood pressure log.  Thank you,  Jossie Ng. Audrie Kuri NP-C    11/15/2020, 7:03 AM [address] [place] [address] Office [phone] Fax [phone]

## 2020-11-16 ENCOUNTER — Encounter (HOSPITAL_BASED_OUTPATIENT_CLINIC_OR_DEPARTMENT_OTHER): Payer: Self-pay

## 2020-11-22 NOTE — Telephone Encounter (Signed)
Spoke with patient and she is going to upload into mychart additional blood pressure readings over the weekend She is concerned about the difference with her right and left arm readings  Have printed for Dr Oval Linsey to review when she is back in the office next week

## 2020-11-28 NOTE — Telephone Encounter (Signed)
Per Dr Oval Linsey have her get carotid Dopplers to rule out subclavian stenosis.  Mychart message sent to patient and has been read by patient

## 2020-12-05 ENCOUNTER — Telehealth (HOSPITAL_BASED_OUTPATIENT_CLINIC_OR_DEPARTMENT_OTHER): Payer: Self-pay | Admitting: Cardiovascular Disease

## 2020-12-05 NOTE — Telephone Encounter (Incomplete)
Patient call asking to speak with the nurse. Please advise

## 2020-12-17 ENCOUNTER — Encounter (HOSPITAL_BASED_OUTPATIENT_CLINIC_OR_DEPARTMENT_OTHER): Payer: Self-pay

## 2020-12-18 ENCOUNTER — Telehealth: Payer: Self-pay | Admitting: Cardiovascular Disease

## 2020-12-18 DIAGNOSIS — I10 Essential (primary) hypertension: Secondary | ICD-10-CM

## 2020-12-18 DIAGNOSIS — Z79899 Other long term (current) drug therapy: Secondary | ICD-10-CM

## 2020-12-18 NOTE — Telephone Encounter (Signed)
Pt c/o BP issue: STAT if pt c/o blurred vision, one-sided weakness or slurred speech  1. What are your last 5 BP readings? 145/98  130/99  2. Are you having any other symptoms (ex. Dizziness, headache, blurred vision, passed out)? Headache   3. What is your BP issue? BP is elevated  Pt is on her way to another Dr's office for a urinalysis  Pt states the BP in her right arm is different in her left arm

## 2020-12-18 NOTE — Addendum Note (Signed)
Addended by: Waylan Rocher on: 12/18/2020 05:20 PM   Modules accepted: Orders

## 2020-12-18 NOTE — Telephone Encounter (Signed)
Please contact Ms. Christianson and let her know that her blood pressures have been reviewed.  I would like her to start on valsartan 80 mg daily.  We will order a BMP in 1 week and have her follow-up with Dr. Oval Linsey as scheduled.  I would also like her to continue to monitor her blood pressure.  Thank you.   Jossie Ng. Cleaver NP-C   12/18/2020, 5:04 PM  [facility]  [address]  Office [phone] Fax [phone] ----------------------------------------------------------- Tried to call pt she states that she is not available right now, she is going to a meeting and cannot talk right now. She states that she will call tomorrow to discuss BP and medication.

## 2020-12-18 NOTE — Telephone Encounter (Signed)
Returned call to pt she states that she does not take HCTZ and she  that she has went to [facility]-and there are lab results from there. She states that her BP "has been too high" 145/98 and 130/99. Informed pt that the 1 BP is just a little high. The other is on the higher side of normal. She states that was supposed she was to notify us if her BP was >130, this is what she is doing. She does have an  appointment with Dr Oval Linsey 12-26-20.

## 2020-12-24 NOTE — Telephone Encounter (Signed)
Left message to call back  

## 2020-12-26 ENCOUNTER — Encounter (HOSPITAL_BASED_OUTPATIENT_CLINIC_OR_DEPARTMENT_OTHER): Payer: Self-pay | Admitting: Cardiovascular Disease

## 2020-12-26 ENCOUNTER — Ambulatory Visit (HOSPITAL_BASED_OUTPATIENT_CLINIC_OR_DEPARTMENT_OTHER): Payer: BC Managed Care – PPO | Admitting: Cardiovascular Disease

## 2020-12-26 ENCOUNTER — Other Ambulatory Visit: Payer: Self-pay

## 2020-12-26 DIAGNOSIS — E7800 Pure hypercholesterolemia, unspecified: Secondary | ICD-10-CM | POA: Diagnosis not present

## 2020-12-26 DIAGNOSIS — R350 Frequency of micturition: Secondary | ICD-10-CM | POA: Diagnosis not present

## 2020-12-26 DIAGNOSIS — I10 Essential (primary) hypertension: Secondary | ICD-10-CM | POA: Diagnosis not present

## 2020-12-26 DIAGNOSIS — I471 Supraventricular tachycardia: Secondary | ICD-10-CM

## 2020-12-26 HISTORY — DX: Pure hypercholesterolemia, unspecified: E78.00

## 2020-12-26 HISTORY — DX: Frequency of micturition: R35.0

## 2020-12-26 HISTORY — DX: Essential (primary) hypertension: I10

## 2020-12-26 NOTE — Telephone Encounter (Signed)
Patient seen in office today. 

## 2020-12-26 NOTE — Progress Notes (Signed)
Cardiology Office Note   Date:  12/26/2020   ID:  Stephanie Trujillo, DOB 1965-08-04, MRN [id number]  PCP:  Day, Jacqlyn Krauss, MD  Cardiologist:   Skeet Latch, MD   No chief complaint on file.   History of Present Illness: Stephanie Trujillo is a 55 y.o. female with SVT and uterine fibroids who presents for follow-up.  She previously saw Dr. Ellyn Hack after an emergency room visit for SVT.  She had an episode in 2016 that was treated with adenosine.  She was instructed to start diltiazem but never used it because it was as needed.  At that time she saw Dr. Bronson Ing and had an echo that revealed LVEF 60-65% with normal diastolic function.  She was seen in the hospital 06/22/16 with SVT.  By the time she arrived her heart rate was slowing and it spontaneously broke.  She followed up with Dr. Ellyn Hack and had not had any recurrent episodes.    At her appointment on 01/2019, her PCP had noted that her BP was starting to become more elevated. It was well controlled in the office. She was encouraged to increase her exercise and work on diet. She saw Coletta Memos, NP 10/2020 after calling in with abp of 130/98. She also reported tingling in her left leg and arm. She was started on HCTZ 12.5 mg. She noted that her blood pressures were higher in the right arm than the in the left. She had UE dopplers with no evidence of any arterial obstruction.  Today, she presents a BP log since seeing her dermatologist in 09/2020. Lately her blood pressures appear to be high intermittently, but she is typically asymptomatic with high blood pressures. Overall, she feels well but knows that she feels "differently," and endorses "weird occurrences" with her body. For instance, she may wake up with "swollen, crusty eyes," and waking up in the morning with the taste of blood in her mouth. For a while now she has had general fatigue. She notes having "crazy pressure on her chest" mostly on the right side, which intermittently  occurs after exercise or getting out of the shower. Currently she is not exercising because she wanted to wait until this consultation due to her elevated blood pressure. Previously her peripheral vision became altered suddenly, which prompted her to visit her ophthalmologist. She saw her PCP last Friday, and had lab work performed including urine workup. At baseline for the last few months, she has had frequent urination throughout the day. She denies any palpitations, or shortness of breath. No lightheadedness, headaches, syncope, orthopnea, or PND. Also has no lower extremity edema.  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   Hx of irritable bowel syndrome    Not problems in years    Hx of  ovarian cyst 2008   Irregular menses    Mastodynia, female 08/2010   transient    Pelvic pain in female 2012   transient   Pure hypercholesterolemia 12/26/2020   Purulent vaginitis 02/2004   SVT (supraventricular tachycardia) (HCC)    tx diltiazem prn   Vulvar lesion 09/2007   Weight loss 07/2006    Past Surgical History:  Procedure Laterality Date   ABDOMINOPLASTY     ABLATION  2010   BREAST CYST ASPIRATION     CESAREAN SECTION  1995   DILATATION & CURRETTAGE/HYSTEROSCOPY WITH RESECTOCOPE N/A 09/17/2017   Procedure: DILATATION & CURETTAGE/HYSTEROSCOPY WITH RESECTOCOPE;  Surgeon: Eldred Manges, MD;  Location: [facility];  Service: Gynecology;  Laterality:  N/A;   HERNIA REPAIR     IR RADIOLOGIST EVAL & MGMT  10/19/2019   IR RADIOLOGIST EVAL & MGMT  02/14/2020   OVARIAN CYST REMOVAL  2001   WISDOM TOOTH EXTRACTION  1990     Current Outpatient Medications  Medication Sig Dispense Refill   amLODipine (NORVASC) 2.5 MG tablet Take 1 tablet by mouth daily.     clobetasol ointment (TEMOVATE) 5.46 % Apply 1 application topically 2 (two) times daily as needed (for eczema).     COVID-19 mRNA Vac-TriS, Pfizer, SUSP injection INJECT AS DIRECTED .3 mL 0   diltiazem (CARDIZEM) 30 MG tablet Take 1 tablet (30 mg total) by mouth every 6 (six) hours as needed (palpitation). 20 tablet 11   fluocinonide cream (LIDEX) 5.68 % Apply 1 application topically 2 (two) times daily as needed (eczema).      triamcinolone cream (KENALOG) 0.1 % Apply 1 application topically 2 (two) times daily as needed (itching/eczema).      valACYclovir (VALTREX) 500 MG tablet Take 500-1,000 mg by mouth daily as needed (for outbreaks/shingles).      No current facility-administered medications for this visit.    Allergies:   Codeine    Social History:  The patient  reports that she has never smoked. She has never used smokeless tobacco. She reports current alcohol use. She reports that she does not use drugs.   Family History:  The patient's family history includes Arrhythmia in her father; Breast cancer in her cousin and cousin; Diabetes in her paternal grandmother; Hypertension in her father; Lupus in her mother; Rheum arthritis in her maternal aunt and sister.    ROS:   Please see the history of present illness. (+) Fatigue (+) Right chest pressure (+) Altered vision (+) Urinary frequency All other systems are reviewed and negative.    PHYSICAL EXAM: VS:  BP 112/68 (BP Location: Right Arm, Patient Position: Sitting, Cuff Size: Large)   Pulse 86   Ht 5' 2.5" (1.588 m)   Wt 162 lb 6.4 oz (73.7 kg)   SpO2 99%   BMI 29.23 kg/m  , BMI Body mass index is 29.23  kg/m. GENERAL:  Well appearing HEENT: Pupils equal round and reactive, fundi not visualized, oral mucosa unremarkable NECK:  No jugular venous distention, waveform within normal limits, carotid upstroke brisk and symmetric, no bruits, no thyromegaly LYMPHATICS:  No cervical adenopathy LUNGS:  Clear to auscultation bilaterally HEART:  RRR.  PMI not displaced or sustained,S1 and S2 within normal limits, no S3, no S4, no clicks, no rubs, no murmurs ABD:  Flat, positive bowel sounds normal in frequency in pitch, no bruits, no rebound, no guarding, no midline pulsatile mass, no hepatomegaly, no splenomegaly EXT:  2 plus pulses throughout, no edema, no cyanosis  no clubbing SKIN:  No rashes no nodules NEURO:  Cranial nerves II through XII grossly intact, motor grossly intact throughout PSYCH:  Cognitively intact, oriented to person place and time   EKG:   12/26/2020: EKG is not ordered today. 10/21/17:  sinus rhythm.  Rate 71 bpm. 04/29/17: sinus rhythm.  Rate 69 bpm.    UE Arterial Duplex 11/28/2020: Final interpretation:  Right: No significant arterial obstruction detected proximal to the wrist in the right upper extremity. Antegrade vertebral.   Left: No significant arterial obstruction detected proximal to the wrist in the left upper extremity. Antegrade vertebral.   14 Day Event Monitor 03/2018:  Quality: Fair.  Baseline artifact. Predominant rhythm: sinus rhythm Average heart rate: 84 bpm Max heart rate: 162 bpm Min heart rate: 54 bpm   One episode of SVT (9 beats, max rate 162 bpm)  ETT 08/28/14: Normal  Echo 08/23/14: Study Conclusions - Left ventricle: The cavity size was normal. Wall thickness was   normal. Systolic function was normal. The estimated ejection   fraction was in the range of 60% to 65%. Wall motion was normal;   there were no regional wall motion abnormalities. Left   ventricular diastolic function parameters were normal. - Left atrium: The atrium was normal in  size.   Impressions: - Normal study.   Recent Labs: No results found for requested labs within last 8760 hours.   06/18/16: Total cholesterol 208, triglycerides 89, HDL 60, LDL 121  Lipid Panel No results found for: CHOL, TRIG, HDL, CHOLHDL, VLDL, LDLCALC, LDLDIRECT    Wt Readings from Last 3 Encounters:  12/26/20 162 lb 6.4 oz (73.7 kg)  11/05/20 164 lb 6.4 oz (74.6 kg)  05/09/18 167 lb 6.4 oz (75.9 kg)      ASSESSMENT AND PLAN: Essential hypertension BP is mildly elevated but labile.  She has signs of end organ damage in her eyes.  Her blood pressure in the office today was actually okay.  We will have her wear a 24-hour ambulatory blood pressure monitor.  Given that there is evidence that her blood pressure has been high on her eye exam, we will start amlodipine 2.5 mg after she wears the ambulatory monitor.  She will continue to track her blood pressures.  She will work on increasing her exercise and limiting salt in her diet.  Given that her blood pressure is so labile, we will check catecholamines and metanephrines.  Thyroid function has been normal.  Pure hypercholesterolemia Lipids are going in the wrong direction.  She is going to start working on diet and exercise.  We will repeat fasting lipids and a CMP in 2 to 3 months.  Ideally she can control this with diet and exercise prior to needing a statin.  Frequent urination She reports frequent urination.  She had a urinalysis that was mildly abnormal but also had some squamous epithelial cells.  Repeat urinalysis.  Paroxysmal SVT (supraventricular tachycardia) Stable.  No recent episodes.  Current medicines are reviewed at length with the patient today.  The patient does not have concerns regarding medicines.  The following changes have been made:  start amlodipine 2.5mg  daily  Labs/ tests ordered today include: none  Orders Placed This Encounter  Procedures   Catecholamines, fractionated, plasma   Metanephrines,  plasma   Urinalysis   Time spent: 45 minutes-Greater than 50% of this time was spent in counseling, explanation of diagnosis, planning of further management, and coordination of care.   Disposition:   FU with  Kayslee Furey C. Oval Linsey, MD, [facility] in 1-2 months   I,Mathew Stumpf,acting as a Education administrator for Skeet Latch, MD.,have documented all relevant documentation on the behalf of Skeet Latch, MD,as directed by  Skeet Latch, MD while in the presence of Skeet Latch, MD.  I, [place] Oval Linsey, MD have reviewed all documentation for this visit.  The documentation of the exam, diagnosis, procedures, and orders on 12/26/2020 are all accurate and complete.    Signed, Yanni Ruberg C. Oval Linsey, MD, [facility]  12/26/2020 12:30 PM    [place]

## 2020-12-26 NOTE — Patient Instructions (Signed)
Medication Instructions:  START AMLODIPINE 2.5 MG DAILY   *If you need a refill on your cardiac medications before your next appointment, please call your pharmacy*  Lab Work: URINALYSIS/CATACHOLAMINES/METANEPHRINE TODAY   If you have labs (blood work) drawn today and your tests are completely normal, you will receive your results only by: [place] (if you have MyChart) OR A paper copy in the mail If you have any lab test that is abnormal or we need to change your treatment, we will call you to review the results.  Testing/Procedures: 24 HOUR BLOOD PRESSURE MONITORING  THE OFFICE WILL CALL YOU TO ARRANGE   Follow-Up: At [facility], you and your health needs are our priority.  As part of our continuing mission to provide you with exceptional heart care, we have created designated Provider Care Teams.  These Care Teams include your primary Cardiologist (physician) and Advanced Practice Providers (APPs -  Physician Assistants and Nurse Practitioners) who all work together to provide you with the care you need, when you need it.  We recommend signing up for the patient portal called "MyChart".  Sign up information is provided on this After Visit Summary.  MyChart is used to connect with patients for Virtual Visits (Telemedicine).  Patients are able to view lab/test results, encounter notes, upcoming appointments, etc.  Non-urgent messages can be sent to your provider as well.   To learn more about what you can do with MyChart, go to [email or web address].    Your next appointment:   1-2 month(s)  The format for your next appointment:   In Person  Provider:   Skeet Latch, MD

## 2020-12-26 NOTE — Assessment & Plan Note (Signed)
Lipids are going in the wrong direction.  She is going to start working on diet and exercise.  We will repeat fasting lipids and a CMP in 2 to 3 months.  Ideally she can control this with diet and exercise prior to needing a statin.

## 2020-12-26 NOTE — Assessment & Plan Note (Addendum)
BP is mildly elevated but labile.  She has signs of end organ damage in her eyes.  Her blood pressure in the office today was actually okay.  We will have her wear a 24-hour ambulatory blood pressure monitor.  Given that there is evidence that her blood pressure has been high on her eye exam, we will start amlodipine 2.5 mg after she wears the ambulatory monitor.  She will continue to track her blood pressures.  She will work on increasing her exercise and limiting salt in her diet.  Given that her blood pressure is so labile, we will check catecholamines and metanephrines.  Thyroid function has been normal.

## 2020-12-26 NOTE — Assessment & Plan Note (Signed)
She reports frequent urination.  She had a urinalysis that was mildly abnormal but also had some squamous epithelial cells.  Repeat urinalysis.

## 2020-12-26 NOTE — Assessment & Plan Note (Signed)
Stable.  No recent episodes.

## 2020-12-26 NOTE — Addendum Note (Signed)
Addended by: Alvina Filbert B on: 12/26/2020 05:08 PM   Modules accepted: Orders

## 2020-12-26 NOTE — Telephone Encounter (Signed)
Patient seen in clinic today

## 2021-01-01 ENCOUNTER — Encounter (HOSPITAL_BASED_OUTPATIENT_CLINIC_OR_DEPARTMENT_OTHER): Payer: Self-pay

## 2021-01-01 LAB — CATECHOLAMINES, FRACTIONATED, PLASMA
Dopamine: 34 pg/mL (ref 0–48)
Epinephrine: 29 pg/mL (ref 0–62)
Norepinephrine: 570 pg/mL (ref 0–874)

## 2021-01-01 LAB — URINALYSIS
Bilirubin, UA: NEGATIVE
Glucose, UA: NEGATIVE
Ketones, UA: NEGATIVE
Nitrite, UA: NEGATIVE
Protein,UA: NEGATIVE
RBC, UA: NEGATIVE
Specific Gravity, UA: 1.017 (ref 1.005–1.030)
Urobilinogen, Ur: 0.2 mg/dL (ref 0.2–1.0)
pH, UA: 5.5 (ref 5.0–7.5)

## 2021-01-01 LAB — METANEPHRINES, PLASMA
Metanephrine, Free: 33 pg/mL (ref 0.0–88.0)
Normetanephrine, Free: 105.8 pg/mL (ref 0.0–244.0)

## 2021-01-09 ENCOUNTER — Telehealth: Payer: Self-pay | Admitting: *Deleted

## 2021-01-09 NOTE — Telephone Encounter (Signed)
Offered appointment on 01/27/21 for 24 hour ambulatory bp monitor.  Holding appointment for patient to confirm.

## 2021-01-14 ENCOUNTER — Telehealth (HOSPITAL_BASED_OUTPATIENT_CLINIC_OR_DEPARTMENT_OTHER): Payer: Self-pay | Admitting: *Deleted

## 2021-01-14 NOTE — Telephone Encounter (Signed)
-----   Message from Skeet Latch, MD sent at 01/13/2021  5:44 AM EDT ----- The hormonal labs looking at causes for spikes in her blood pressure were normal.  Urinalysis is somewhat indeterminate.  Recommend repeating it to get another sample.  Please send that for culture as well.

## 2021-01-14 NOTE — Telephone Encounter (Signed)
Left message to call back  

## 2021-01-16 ENCOUNTER — Encounter (HOSPITAL_BASED_OUTPATIENT_CLINIC_OR_DEPARTMENT_OTHER): Payer: Self-pay

## 2021-01-16 NOTE — Telephone Encounter (Signed)
Left message for patient to call back  

## 2021-01-16 NOTE — Telephone Encounter (Signed)
Patient was returning call 

## 2021-01-17 ENCOUNTER — Other Ambulatory Visit: Payer: Self-pay | Admitting: *Deleted

## 2021-01-17 DIAGNOSIS — R350 Frequency of micturition: Secondary | ICD-10-CM

## 2021-01-17 NOTE — Telephone Encounter (Signed)
Pt reviewed recommendations via mychart and u/a and culture order entered in system Lm with pt to call if has any questions otherwise just need to repeat u/a with culture .Adonis Housekeeper

## 2021-01-23 ENCOUNTER — Ambulatory Visit (HOSPITAL_BASED_OUTPATIENT_CLINIC_OR_DEPARTMENT_OTHER): Payer: BC Managed Care – PPO | Admitting: Cardiovascular Disease

## 2021-01-27 ENCOUNTER — Other Ambulatory Visit (HOSPITAL_BASED_OUTPATIENT_CLINIC_OR_DEPARTMENT_OTHER): Payer: Self-pay | Admitting: Cardiovascular Disease

## 2021-01-27 ENCOUNTER — Other Ambulatory Visit: Payer: Self-pay

## 2021-01-27 ENCOUNTER — Ambulatory Visit (INDEPENDENT_AMBULATORY_CARE_PROVIDER_SITE_OTHER): Payer: BC Managed Care – PPO

## 2021-01-27 DIAGNOSIS — R030 Elevated blood-pressure reading, without diagnosis of hypertension: Secondary | ICD-10-CM

## 2021-01-27 DIAGNOSIS — I10 Essential (primary) hypertension: Secondary | ICD-10-CM

## 2021-01-27 NOTE — Progress Notes (Unsigned)
24 Hour Ambulatory Blood Pressure monitor applied to patients right arm using adult long cuff. Note: patient states blood pressure on right are runs significantly higher than left arm.

## 2021-02-03 ENCOUNTER — Ambulatory Visit (HOSPITAL_BASED_OUTPATIENT_CLINIC_OR_DEPARTMENT_OTHER): Payer: BC Managed Care – PPO | Admitting: Cardiovascular Disease

## 2021-02-06 ENCOUNTER — Ambulatory Visit (HOSPITAL_BASED_OUTPATIENT_CLINIC_OR_DEPARTMENT_OTHER): Payer: BC Managed Care – PPO | Admitting: Cardiovascular Disease

## 2021-02-06 ENCOUNTER — Other Ambulatory Visit: Payer: Self-pay

## 2021-02-06 ENCOUNTER — Encounter (HOSPITAL_BASED_OUTPATIENT_CLINIC_OR_DEPARTMENT_OTHER): Payer: Self-pay | Admitting: Cardiovascular Disease

## 2021-02-06 VITALS — BP 116/72 | HR 76 | Ht 62.5 in | Wt 166.4 lb

## 2021-02-06 DIAGNOSIS — R400 Somnolence: Secondary | ICD-10-CM

## 2021-02-06 DIAGNOSIS — I10 Essential (primary) hypertension: Secondary | ICD-10-CM

## 2021-02-06 DIAGNOSIS — R0683 Snoring: Secondary | ICD-10-CM | POA: Diagnosis not present

## 2021-02-06 DIAGNOSIS — I471 Supraventricular tachycardia: Secondary | ICD-10-CM

## 2021-02-06 HISTORY — DX: Snoring: R06.83

## 2021-02-06 NOTE — Patient Instructions (Addendum)
Medication Instructions:  Your physician recommends that you continue on your current medications as directed. Please refer to the Current Medication list given to you today.   *If you need a refill on your cardiac medications before your next appointment, please call your pharmacy*  Lab Work: NONE   Testing/Procedures: [facility] HOME SLEEP STUDY   Follow-Up: At [facility], you and your health needs are our priority.  As part of our continuing mission to provide you with exceptional heart care, we have created designated Provider Care Teams.  These Care Teams include your primary Cardiologist (physician) and Advanced Practice Providers (APPs -  Physician Assistants and Nurse Practitioners) who all work together to provide you with the care you need, when you need it.  We recommend signing up for the patient portal called "MyChart".  Sign up information is provided on this After Visit Summary.  MyChart is used to connect with patients for Virtual Visits (Telemedicine).  Patients are able to view lab/test results, encounter notes, upcoming appointments, etc.  Non-urgent messages can be sent to your provider as well.   To learn more about what you can do with MyChart, go to [email or web address].    Your next appointment:   12 month(s)  The format for your next appointment:   In Person  Provider:   Skeet Latch, MD   Other Instructions  WatchPAT?  Is a FDA cleared portable home sleep study test that uses a watch and 3 points of contact to monitor 7 different channels, including your heart rate, oxygen saturations, body position, snoring, and chest motion.  The study is easy to use from the comfort of your own home and accurately detect sleep apnea.  Before bed, you attach the chest sensor, attached the sleep apnea bracelet to your nondominant hand, and attach the finger probe.  After the study, the raw data is downloaded from the watch and scored for apnea events.   For more  information: [email or web address]/[email or web address]

## 2021-02-06 NOTE — Assessment & Plan Note (Addendum)
She did a 24 hour ambulatory BP that showed that her BP is not elevated.  She never started taking amlodipine. We will take it off her medication list.  She is going to track her blood pressure once a week at home.  She will also continue to limit sodium intake and increase her exercise at least 150 minutes weekly.

## 2021-02-06 NOTE — Assessment & Plan Note (Signed)
No recent episodes.  Continue diltiazem.

## 2021-02-06 NOTE — Assessment & Plan Note (Signed)
Dr. Melina Copa notes that she has been snoring.  She bought a new bed which told her that she is only sleeping about 4 hours a night.  She finds her self waking herself up at night at times.  She feels sleepy throughout the day.  We will get her set up with a sleep study.

## 2021-02-06 NOTE — Progress Notes (Signed)
Cardiology Office Note   Date:  02/06/2021   ID:  Stephanie Trujillo, DOB 1965/05/17, MRN [id number]  PCP:  Day, Jacqlyn Krauss, MD  Cardiologist:   Luiz Ochoa   No chief complaint on file.   History of Present Illness: Stephanie Trujillo is a 55 y.o. female with SVT and uterine fibroids who presents for follow-up.  She previously saw Dr. Ellyn Hack after an emergency room visit for SVT.  She had an episode in 2016 that was treated with adenosine.  She was instructed to start diltiazem but never used it because it was as needed.  At that time she saw Dr. Bronson Ing and had an echo that revealed LVEF 60-65% with normal diastolic function.  She was seen in the hospital 06/22/16 with SVT.  By the time she arrived her heart rate was slowing and it spontaneously broke.  She followed up with Dr. Ellyn Hack and had not had any recurrent episodes.    On 01/2019, her PCP had noted that her BP was starting to become more elevated. It was well controlled in the office. She was encouraged to increase her exercise and work on diet. She saw Coletta Memos, NP 10/2020 after calling in with abp of 130/98. She also reported tingling in her left leg and arm. She was started on HCTZ 12.5 mg. She noted that her blood pressures were higher in the right arm than the in the left. She had UE dopplers with no evidence of any arterial obstruction.  At her last appointment, she still reported intermittently elevated blood pressures. She had some atypical right-sided chest pain as well. She wore an ambulatory blood pressure monitor with an average blood pressure of 126/80. Today, she says that she has been doing great overall, and no longer has issues with back pain. However, she does report having fatigue and headaches. She has not been taking any medication for her blood pressure. When she was wearing her BP monitor, she had one event causing her to take off her monitor at night. She woke up and her monitor was continually squeezing  her arm. She believes it could have been malfunctioning. She plans to improve her diet and exercise. She has not been exercising currently due to her busy schedule, but she is trying to lose weight. She notes that her high blood pressure readings would come from eating popcorn every night, so she has reduced salt in her diet. She used to have soup, until she realized how much sodium was in it. She adds that she does not eat chips. She bought a new bed, but has not been sleeping due to her husband's snoring and his CPAP machine. She does not feel rested after sleeping, and typically only has 4.5-5 hours of quality sleep a night. Her family has told her that she snores. She denies any palpitations, chest pain, or shortness of breath. No lightheadedness, syncope, orthopnea, PND, lower extremity edema or exertional symptoms.  Past Medical History:  Diagnosis Date   Abnormal Pap smear    laser surgery '88   Anemia 02/2008   Bacterial vaginosis 2005   Condylomata acuminata    Ecchymosis 2011   Endometrial polyp 08/02/03   Essential hypertension 12/26/2020   FH: diabetes mellitus    FH: hypertension    FH: lupus    FH: rheumatoid arthritis    FH: stroke    FH: thyroid disease    Fibroid 2009   Frequent urination 12/26/2020   H/O candidiasis  H/O cervicitis 2002   H/O dysmenorrhea 2005   H/O vaginal bleeding 04/2010   H/O varicella    H/O: menorrhagia 08/02/2003   Hair loss 2008   HPV in female 85   Hx of irritable bowel syndrome    Not problems in years    Hx of ovarian cyst 2008   Irregular menses    Mastodynia, female 08/2010   transient    Pelvic pain in female 2012   transient   Pure hypercholesterolemia 12/26/2020   Purulent vaginitis 02/2004   Snoring 02/06/2021   SVT (supraventricular tachycardia) (HCC)    tx diltiazem prn   Vulvar lesion 09/2007   Weight loss 07/2006    Past Surgical History:  Procedure Laterality Date   ABDOMINOPLASTY     ABLATION  2010   BREAST CYST  ASPIRATION     CESAREAN SECTION  1995   DILATATION & CURRETTAGE/HYSTEROSCOPY WITH RESECTOCOPE N/A 09/17/2017   Procedure: DILATATION & CURETTAGE/HYSTEROSCOPY WITH RESECTOCOPE;  Surgeon: Eldred Manges, MD;  Location: [facility];  Service: Gynecology;  Laterality: N/A;   HERNIA REPAIR     IR RADIOLOGIST EVAL & MGMT  10/19/2019   IR RADIOLOGIST EVAL & MGMT  02/14/2020   OVARIAN CYST REMOVAL  2001   WISDOM TOOTH EXTRACTION  1990     Current Outpatient Medications  Medication Sig Dispense Refill   clobetasol ointment (TEMOVATE) 9.37 % Apply 1 application topically 2 (two) times daily as needed (for eczema).     COVID-19 mRNA Vac-TriS, Pfizer, SUSP injection INJECT AS DIRECTED .3 mL 0   diltiazem (CARDIZEM) 30 MG tablet Take 1 tablet (30 mg total) by mouth every 6 (six) hours as needed (palpitation). 20 tablet 11   fluocinonide cream (LIDEX) 1.69 % Apply 1 application topically 2 (two) times daily as needed (eczema).      triamcinolone cream (KENALOG) 0.1 % Apply 1 application topically 2 (two) times daily as needed (itching/eczema).      valACYclovir (VALTREX) 1000 MG tablet Take 1,000 mg by mouth daily as needed (for outbreaks/shingles).     No current facility-administered medications for this visit.    Allergies:   Codeine    Social History:  The patient  reports that she has never smoked. She has never used smokeless tobacco. She reports current alcohol use. She reports that she does not use drugs.   Family History:  The patient's family history includes Arrhythmia in her father; Breast cancer in her cousin and cousin; Diabetes in her paternal grandmother; Hypertension in her father; Lupus in her mother; Rheum arthritis in her maternal aunt and sister.    ROS:   Please see the history of present illness. (+) Fatigue (+) Headache (+) Snoring  (+) Insomnia All other systems are reviewed and negative.    PHYSICAL EXAM: VS:  BP 116/72 (BP Location: Right Arm, Patient Position:  Sitting, Cuff Size: Large)   Pulse 76   Ht 5' 2.5" (1.588 m)   Wt 166 lb 6.4 oz (75.5 kg)   SpO2 91%   BMI 29.95 kg/m  , BMI Body mass index is 29.95 kg/m. GENERAL:  Well appearing HEENT: Pupils equal round and reactive, fundi not visualized, oral mucosa unremarkable NECK:  No jugular venous distention, waveform within normal limits, carotid upstroke brisk and symmetric, no bruits, no thyromegaly LUNGS:  Clear to auscultation bilaterally HEART:  RRR.  PMI not displaced or sustained,S1 and S2 within normal limits, no S3, no S4, no clicks, no rubs, no murmurs ABD:  Flat, positive bowel sounds normal in frequency in pitch, no bruits, no rebound, no guarding, no midline pulsatile mass, no hepatomegaly, no splenomegaly EXT:  2 plus pulses throughout, no edema, no cyanosis no clubbing SKIN:  No rashes no nodules NEURO:  Cranial nerves II through XII grossly intact, motor grossly intact throughout PSYCH:  Cognitively intact, oriented to person place and time  EKG:   02/06/2021: EKG was not ordered. 12/26/2020: EKG was not ordered. 10/21/17:  sinus rhythm.  Rate 71 bpm. 04/29/17: sinus rhythm.  Rate 69 bpm.    Blood Pressure Monitor 01/27/2021: 24 Hour Ambulatory Blood Pressure   Overall Average BP: 126/80, HR: 83 Awake Average: 127/81, HR 83 Asleep Average: 124/75, HR 82  UE Arterial Duplex 11/28/2020: Final interpretation:  Right: No significant arterial obstruction detected proximal to the wrist in the right upper extremity. Antegrade vertebral.   Left: No significant arterial obstruction detected proximal to the wrist in the left upper extremity. Antegrade vertebral.   14 Day Event Monitor 03/2018:  Quality: Fair.  Baseline artifact. Predominant rhythm: sinus rhythm Average heart rate: 84 bpm Max heart rate: 162 bpm Min heart rate: 54 bpm   One episode of SVT (9 beats, max rate 162 bpm)  ETT 08/28/14: Normal  Echo 08/23/14: Study Conclusions - Left ventricle: The cavity size  was normal. Wall thickness was   normal. Systolic function was normal. The estimated ejection   fraction was in the range of 60% to 65%. Wall motion was normal;   there were no regional wall motion abnormalities. Left   ventricular diastolic function parameters were normal. - Left atrium: The atrium was normal in size.   Impressions: - Normal study.   Recent Labs: No results found for requested labs within last 8760 hours.   06/18/16: Total cholesterol 208, triglycerides 89, HDL 60, LDL 121  Lipid Panel No results found for: CHOL, TRIG, HDL, CHOLHDL, VLDL, LDLCALC, LDLDIRECT    Wt Readings from Last 3 Encounters:  02/06/21 166 lb 6.4 oz (75.5 kg)  12/26/20 162 lb 6.4 oz (73.7 kg)  11/05/20 164 lb 6.4 oz (74.6 kg)      ASSESSMENT AND PLAN:  Essential hypertension She did a 24 hour ambulatory BP that showed that her BP is not elevated.  She never started taking amlodipine. We will take it off her medication list.  She is going to track her blood pressure once a week at home.  She will also continue to limit sodium intake and increase her exercise at least 150 minutes weekly.  Paroxysmal SVT (supraventricular tachycardia) No recent episodes.  Continue diltiazem.  Snoring Dr. Melina Copa notes that she has been snoring.  She bought a new bed which told her that she is only sleeping about 4 hours a night.  She finds her self waking herself up at night at times.  She feels sleepy throughout the day.  We will get her set up with a sleep study.  Current medicines are reviewed at length with the patient today.  The patient does not have concerns regarding medicines.  The following changes have been made:  start amlodipine 2.5mg  daily  Labs/ tests ordered today include: none  Orders Placed This Encounter  Procedures   [place] Sleep Study     Disposition:   FU with Webber Michiels C. Oval Linsey, MD, [facility] in 1 year    I,Tinashe Williams,acting as a scribe for Skeet Latch, MD.,have  documented all relevant documentation on the behalf of Skeet Latch, MD,as directed by  Skeet Latch, MD while in the presence of Skeet Latch, MD.   I, [place] Oval Linsey, MD have reviewed all documentation for this visit.  The documentation of the exam, diagnosis, procedures, and orders on 02/06/2021 are all accurate and complete.   Signed, Fredick Schlosser C. Oval Linsey, MD, [facility]  02/06/2021 3:16 PM    [place]

## 2021-02-10 ENCOUNTER — Encounter (HOSPITAL_BASED_OUTPATIENT_CLINIC_OR_DEPARTMENT_OTHER): Payer: Self-pay | Admitting: *Deleted

## 2021-02-10 ENCOUNTER — Encounter (HOSPITAL_BASED_OUTPATIENT_CLINIC_OR_DEPARTMENT_OTHER): Payer: Self-pay

## 2021-02-16 ENCOUNTER — Encounter (INDEPENDENT_AMBULATORY_CARE_PROVIDER_SITE_OTHER): Payer: BC Managed Care – PPO | Admitting: Cardiology

## 2021-02-16 DIAGNOSIS — I471 Supraventricular tachycardia: Secondary | ICD-10-CM

## 2021-02-16 DIAGNOSIS — R0683 Snoring: Secondary | ICD-10-CM

## 2021-02-20 NOTE — Procedures (Signed)
   Sleep Study Report  Patient Information Study Date: 02/16/21 Patient Name: Stephanie Trujillo Patient ID: [id number] Birth Date: 05/23/2065 Age: 55 Gender: Female BMI: 30.4 (W=165 lb, H=5' 2'') Referring Physician: Skeet Latch, MD  TEST DESCRIPTION: Home sleep apnea testing was completed using the WatchPat, a Type 1 device, utilizing peripheral arterial tonometry (PAT), chest movement, actigraphy, pulse oximetry, pulse rate, body position and snore. AHI was calculated with apnea and hypopnea using valid sleep time as the denominator. RDI includes apneas, hypopneas, and RERAs. The data acquired and the scoring of sleep and all associated events were performed in accordance with the recommended standards and specifications as outlined in the AASM Manual for the Scoring of Sleep and Associated Events 2.2.0 (2015).  FINDINGS: 1. No evidence of Obstructive Sleep Apnea with AHI 1.1/hr. 2. No Central Sleep Apnea. 3. Oxygen desaturations as low as 91%. 4. Minimal snoring was present. O2 sats were < 88% for 0 minutes. 5. Total sleep time was 3 hrs and 41 min. 6. 25.1% of total sleep time was spent in REM sleep. 7. Normal sleep onset latency at 18 min. 8. Shortened REM sleep onset latency at 76min. 9. Total awakenings were 3.  DIAGNOSIS: Normal study with no significant sleep disordered breathing.  RECOMMENDATIONS: 1. Normal study with no significant sleep disordered breathing.  2. Healthy sleep recommendations include: adequate nightly sleep (normal 7-9 hrs/night), avoidance of caffeine after noon and alcohol near bedtime, and maintaining a sleep environment that is cool, dark and quiet.  3. Weight loss for overweight patients is recommended.  4. Snoring recommendations include: weight loss where appropriate, side sleeping, and avoidance of alcohol before bed.  5. Operation of motor vehicle or dangerous equipment must be avoided when feeling drowsy, excessively sleepy,  or mentally fatigued.  6. An ENT consultation which may be useful for specific causes of and possible treatment of bothersome snoring .  7. Weight loss may be of benefit in reducing the severity of snoring.   Signature: Electronically Signed: 02/21/21 Fransico Him, MD; [facility]; [place], American Board of Sleep Medicine

## 2021-02-23 ENCOUNTER — Ambulatory Visit: Payer: BC Managed Care – PPO

## 2021-02-23 DIAGNOSIS — R0683 Snoring: Secondary | ICD-10-CM

## 2021-02-23 DIAGNOSIS — I10 Essential (primary) hypertension: Secondary | ICD-10-CM

## 2021-02-23 DIAGNOSIS — R400 Somnolence: Secondary | ICD-10-CM

## 2021-02-23 DIAGNOSIS — I471 Supraventricular tachycardia: Secondary | ICD-10-CM

## 2021-02-25 ENCOUNTER — Telehealth: Payer: Self-pay | Admitting: *Deleted

## 2021-02-25 NOTE — Telephone Encounter (Signed)
-----   Message from Sueanne Margarita, MD sent at 02/20/2021 10:29 PM EST ----- Please let patient know that sleep study showed no significant sleep apnea.

## 2021-02-25 NOTE — Telephone Encounter (Signed)
The patient has been notified of the normal result.  Left detailed message on voicemail and informed patient to call back with questions.  Marolyn Hammock, [place] 02/25/2021 2:49 PM

## 2021-03-06 NOTE — Telephone Encounter (Signed)
Patient seen by Dr Oval Linsey 01/2021

## 2021-03-30 ENCOUNTER — Encounter (HOSPITAL_BASED_OUTPATIENT_CLINIC_OR_DEPARTMENT_OTHER): Payer: Self-pay | Admitting: Cardiovascular Disease

## 2021-04-21 ENCOUNTER — Encounter (HOSPITAL_BASED_OUTPATIENT_CLINIC_OR_DEPARTMENT_OTHER): Payer: Self-pay | Admitting: Cardiovascular Disease

## 2021-05-27 ENCOUNTER — Encounter (HOSPITAL_BASED_OUTPATIENT_CLINIC_OR_DEPARTMENT_OTHER): Payer: Self-pay | Admitting: Cardiovascular Disease

## 2021-05-27 NOTE — Telephone Encounter (Signed)
Please advise 

## 2021-06-10 NOTE — Telephone Encounter (Signed)
Already routed to Dr. Blenda Mounts team, will let the patient know!  ?

## 2021-08-26 ENCOUNTER — Other Ambulatory Visit: Payer: Self-pay | Admitting: Obstetrics and Gynecology

## 2021-08-26 DIAGNOSIS — Z1231 Encounter for screening mammogram for malignant neoplasm of breast: Secondary | ICD-10-CM

## 2021-09-12 ENCOUNTER — Ambulatory Visit: Payer: BC Managed Care – PPO

## 2021-09-17 ENCOUNTER — Encounter: Payer: Self-pay | Admitting: Radiology

## 2021-09-17 ENCOUNTER — Ambulatory Visit
Admission: RE | Admit: 2021-09-17 | Discharge: 2021-09-17 | Disposition: A | Discharge status: Home / Self Care | Payer: BC Managed Care – PPO | Source: Ambulatory Visit | Attending: Obstetrics and Gynecology | Admitting: Obstetrics and Gynecology

## 2021-09-17 DIAGNOSIS — Z1231 Encounter for screening mammogram for malignant neoplasm of breast: Secondary | ICD-10-CM

## 2021-12-06 ENCOUNTER — Other Ambulatory Visit: Payer: Self-pay

## 2021-12-06 ENCOUNTER — Encounter (HOSPITAL_BASED_OUTPATIENT_CLINIC_OR_DEPARTMENT_OTHER): Payer: Self-pay

## 2021-12-06 ENCOUNTER — Emergency Department (HOSPITAL_BASED_OUTPATIENT_CLINIC_OR_DEPARTMENT_OTHER)
Admission: EM | Admit: 2021-12-06 | Discharge: 2021-12-06 | Discharge status: Against Medical Advice | Payer: BC Managed Care – PPO | Attending: Emergency Medicine | Admitting: Emergency Medicine

## 2021-12-06 DIAGNOSIS — R109 Unspecified abdominal pain: Secondary | ICD-10-CM | POA: Diagnosis not present

## 2021-12-06 DIAGNOSIS — R1900 Intra-abdominal and pelvic swelling, mass and lump, unspecified site: Secondary | ICD-10-CM | POA: Diagnosis not present

## 2021-12-06 DIAGNOSIS — K625 Hemorrhage of anus and rectum: Secondary | ICD-10-CM | POA: Diagnosis present

## 2021-12-06 DIAGNOSIS — Z5321 Procedure and treatment not carried out due to patient leaving prior to being seen by health care provider: Secondary | ICD-10-CM | POA: Insufficient documentation

## 2021-12-06 LAB — URINALYSIS, ROUTINE W REFLEX MICROSCOPIC
Bilirubin Urine: NEGATIVE
Glucose, UA: NEGATIVE mg/dL
Hgb urine dipstick: NEGATIVE
Ketones, ur: NEGATIVE mg/dL
Leukocytes,Ua: NEGATIVE
Nitrite: NEGATIVE
Protein, ur: NEGATIVE mg/dL
Specific Gravity, Urine: 1.018 (ref 1.005–1.030)
pH: 7.5 (ref 5.0–8.0)

## 2021-12-06 LAB — PREGNANCY, URINE: Preg Test, Ur: NEGATIVE

## 2021-12-06 NOTE — ED Triage Notes (Signed)
Pt states that she has had abd swelling and burning. Pt states that she noted blood from rectum today. Pt states she has bright red blood. Pt states symptoms since Thursday . Pt describes pain as cramping.

## 2021-12-06 NOTE — ED Notes (Signed)
Called x 3 for rooming

## 2021-12-11 ENCOUNTER — Telehealth: Payer: Self-pay | Admitting: Cardiovascular Disease

## 2021-12-11 NOTE — Telephone Encounter (Signed)
Returned call to patient,   Patient states she is calling just to be on the safe side before she leave the country on [place]. She notes more frequent palpitations or SVT than in the past. She explains it as a very noticeable slow fluttering, but emphasizes that it is very very slow. She endorses having COVID in early august and isn't sure if they could be related. She states after the [place] she was having lots of faint feeling episodes, she also notes a cough. She says she cannot understand why she cough's because there is nothing for her to cough up. She denies any caffeine in her diet. States this has been happening for a couple weeks. Has not checked her blood pressure or heart rate during these episodes. She does notice that it is way more pronounced during the night time.

## 2021-12-11 NOTE — Telephone Encounter (Signed)
Patient c/o Palpitations:  High priority if patient c/o lightheadedness, shortness of breath, or chest pain  How long have you had palpitations/irregular HR/ Afib? Are you having the symptoms now? A few weeks. 2 weeks ago when it became more pronounced   Are you currently experiencing lightheadedness, SOB or CP? Occasional CP, but not currently.   Do you have a history of afib (atrial fibrillation) or irregular heart rhythm? Yes.   Have you checked your BP or HR? (document readings if available): No  Are you experiencing any other symptoms? Pt's states that she has experienced rapid heart rate before but since pt's was diagnosed with covid the first part of August, pt states that she has been experiencing slow heart rate.

## 2021-12-12 ENCOUNTER — Encounter (HOSPITAL_BASED_OUTPATIENT_CLINIC_OR_DEPARTMENT_OTHER): Payer: Self-pay | Admitting: Cardiovascular Disease

## 2021-12-12 NOTE — Telephone Encounter (Signed)
December 12, 2021 Skeet Latch, MD to Gerald Stabs, RN  Me      12/12/21  3:54 PM  Try getting a Kardia device to record it.  Without seeing it I can't advise.  If she checks her HR/BP and HR is high when it happens she can try taking diltiazem prn that is ordered.   Left message to call back

## 2021-12-12 NOTE — Telephone Encounter (Signed)
Patient is returning call. States she will be leaving the country Monday morning and will likely be unavailable if her call is returned. She requested feedback on MyChart if possible.

## 2021-12-12 NOTE — Telephone Encounter (Signed)
Sent mychart as requested

## 2022-03-25 ENCOUNTER — Ambulatory Visit (HOSPITAL_BASED_OUTPATIENT_CLINIC_OR_DEPARTMENT_OTHER): Payer: BC Managed Care – PPO | Admitting: Cardiovascular Disease

## 2022-10-21 ENCOUNTER — Other Ambulatory Visit: Payer: Self-pay | Admitting: Obstetrics and Gynecology

## 2022-10-21 DIAGNOSIS — Z1231 Encounter for screening mammogram for malignant neoplasm of breast: Secondary | ICD-10-CM

## 2022-10-28 ENCOUNTER — Ambulatory Visit: Payer: BC Managed Care – PPO

## 2022-11-04 ENCOUNTER — Ambulatory Visit
Admission: RE | Admit: 2022-11-04 | Discharge: 2022-11-04 | Disposition: A | Discharge status: Home / Self Care | Payer: BC Managed Care – PPO | Source: Ambulatory Visit | Attending: Obstetrics and Gynecology | Admitting: Obstetrics and Gynecology

## 2022-11-04 DIAGNOSIS — Z1231 Encounter for screening mammogram for malignant neoplasm of breast: Secondary | ICD-10-CM

## 2022-11-09 ENCOUNTER — Other Ambulatory Visit: Payer: Self-pay | Admitting: Obstetrics and Gynecology

## 2022-11-09 DIAGNOSIS — R928 Other abnormal and inconclusive findings on diagnostic imaging of breast: Secondary | ICD-10-CM

## 2022-11-18 ENCOUNTER — Ambulatory Visit
Admission: RE | Admit: 2022-11-18 | Discharge: 2022-11-18 | Disposition: A | Discharge status: Home / Self Care | Payer: BC Managed Care – PPO | Source: Ambulatory Visit | Attending: Obstetrics and Gynecology | Admitting: Obstetrics and Gynecology

## 2022-11-18 ENCOUNTER — Other Ambulatory Visit: Payer: Self-pay | Admitting: Obstetrics and Gynecology

## 2022-11-18 ENCOUNTER — Ambulatory Visit: Admission: RE | Admit: 2022-11-18 | Payer: BC Managed Care – PPO | Source: Ambulatory Visit

## 2022-11-18 DIAGNOSIS — R928 Other abnormal and inconclusive findings on diagnostic imaging of breast: Secondary | ICD-10-CM

## 2022-11-18 DIAGNOSIS — N6310 Unspecified lump in the right breast, unspecified quadrant: Secondary | ICD-10-CM

## 2023-05-14 ENCOUNTER — Encounter (HOSPITAL_BASED_OUTPATIENT_CLINIC_OR_DEPARTMENT_OTHER): Payer: Self-pay | Admitting: Family

## 2023-05-14 ENCOUNTER — Ambulatory Visit (HOSPITAL_BASED_OUTPATIENT_CLINIC_OR_DEPARTMENT_OTHER): Payer: BC Managed Care – PPO | Admitting: Family

## 2023-05-14 VITALS — BP 116/82 | HR 77 | Ht 63.0 in | Wt 176.1 lb

## 2023-05-14 DIAGNOSIS — R079 Chest pain, unspecified: Secondary | ICD-10-CM | POA: Diagnosis not present

## 2023-05-14 DIAGNOSIS — R002 Palpitations: Secondary | ICD-10-CM | POA: Diagnosis not present

## 2023-05-14 DIAGNOSIS — I10 Essential (primary) hypertension: Secondary | ICD-10-CM | POA: Diagnosis not present

## 2023-05-14 DIAGNOSIS — I471 Supraventricular tachycardia, unspecified: Secondary | ICD-10-CM | POA: Diagnosis not present

## 2023-05-14 MED ORDER — DILTIAZEM HCL 30 MG PO TABS: 30.0000 mg | ORAL_TABLET | Freq: Four times a day (QID) | ORAL | 11 refills | Status: AC | PRN

## 2023-05-14 MED ORDER — FLUOCINONIDE 0.05 % EX CREA: 1.0000 | TOPICAL_CREAM | Freq: Two times a day (BID) | CUTANEOUS | 0 refills | Status: AC | PRN

## 2023-05-14 MED ORDER — METOPROLOL TARTRATE 100 MG PO TABS
ORAL_TABLET | ORAL | 0 refills | Status: AC
Start: 2023-05-14 — End: ?

## 2023-05-14 NOTE — Patient Instructions (Signed)
Medication Instructions:  Your physician recommends that you continue on your current medications as directed. Please refer to the Current Medication list given to you today.  Testing/Procedures:   Your cardiac CT will be scheduled at one of the below locations:   [facility] [address] [place], [place] [address] [phone]  If scheduled at [facility], please arrive at the [facility] and Children's Entrance (Entrance C2) of [facility] 30 minutes prior to test start time. You can use the FREE valet parking offered at entrance C (encouraged to control the heart rate for the test)  Proceed to the [facility] Radiology Department (first floor) to check-in and test prep.  All radiology patients and guests should use entrance C2 at [facility], accessed from [facility], even though the hospital's physical address listed is [address]    Please follow these instructions carefully (unless otherwise directed):  An IV will be required for this test and Nitroglycerin will be given.  On the Night Before the Test: Be sure to Drink plenty of water. Do not consume any caffeinated/decaffeinated beverages or chocolate 12 hours prior to your test. Do not take any antihistamines 12 hours prior to your test.  On the Day of the Test: Drink plenty of water until 1 hour prior to the test. Do not eat any food 1 hour prior to test. You may take your regular medications prior to the test.  Take metoprolol (Lopressor)100mg   two hours prior to test. Patients who wear a continuous glucose monitor MUST remove the device prior to scanning. FEMALES- please wear underwire-free bra if available, avoid dresses & tight clothing      After the Test: Drink plenty of water. After receiving IV contrast, you may experience a mild flushed feeling. This is normal. On occasion, you may experience a mild rash up to 24 hours after the test. This is not  dangerous. If this occurs, you can take Benadryl 25 mg, Zyrtec, Claritin, or Allegra and increase your fluid intake. (Patients taking Tikosyn should avoid Benadryl, and may take Zyrtec, Claritin, or Allegra) If you experience trouble breathing, this can be serious. If it is severe call 911 IMMEDIATELY. If it is mild, please call our office.  We will call to schedule your test 2-4 weeks out understanding that some insurance companies will need an authorization prior to the service being performed.   For more information and frequently asked questions, please visit our website : [email or web address]  For non-scheduling related questions, please contact the cardiac imaging nurse navigator should you have any questions/concerns: Cardiac Imaging Nurse Navigators Direct Office Dial: [phone]   For scheduling needs, including cancellations and rescheduling, please call [place], [phone].  Follow-Up: At [facility], you and your health needs are our priority.  As part of our continuing mission to provide you with exceptional heart care, we have created designated Provider Care Teams.  These Care Teams include your primary Cardiologist (physician) and Advanced Practice Providers (APPs -  Physician Assistants and Nurse Practitioners) who all work together to provide you with the care you need, when you need it.  We recommend signing up for the patient portal called "MyChart".  Sign up information is provided on this After Visit Summary.  MyChart is used to connect with patients for Virtual Visits (Telemedicine).  Patients are able to view lab/test results, encounter notes, upcoming appointments, etc.  Non-urgent messages can be sent to your provider as well.  To learn more about what you can do with MyChart, go to [email or web address].    Your next appointment:   2-3 months with Dr. Duke Salvia, Gillian Shields, NP or Eligha Bridegroom, NP

## 2023-05-14 NOTE — Progress Notes (Signed)
Cardiology Office Note:  .   Date:  05/14/2023  ID:  Stephanie Trujillo, DOB 23-Oct-1965, MRN [id number] PCP: Day, Pernell Dupre, MD  [facility] Providers Cardiologist:  Chilton Si, MD    History of Present Illness: .   Stephanie Trujillo is a 58 y.o. female with history of SVT, uterine fibroids, HTN, chest pain.   Last seen 02/06/2021.  24-hour blood pressure monitor from the prior showed overall average 126/80.  As she was not taking she was recommended to remain off amlodipine.  She was recommend for sleep study which revealed no sleep apnea.  She saw [facility] cardiology 02/13/2022 for palpitations and chest pain.  Echo 03/27/2022 normal LVEF >55%, mild MR. ZIO with predominantly NSR average heart rate 88 bpm 1 run of SVT and episodes of ectopic atrial rhythm. Stress test ordered but does not appear it was performed. CT 10/12/22 at [facility] with no significant calcification in coronary arteries.   Presents today for chest pain. She presents today for follow up independently. She reports 3 weeks ago had "worst chest pain of my life" in her mid sternal region from front to back described as "squeezing" and felt worse with movement and breathing. It occurred suddenly while she was sitting. 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 She also notes a "peculiarness" with her heart beating occasionally slower with a "thud". Notes this is occurring daily but not at random times and no clear triggers. Does not drink caffeine nor alcohol. She does have Kardia device at home.   She also notes last Friday evening felt she felt she had something in her throat. Notes this occurs daily feels like something is stuck in her throat. Reports every once in awhile she has coughed up a white speck (consider etiology tonsil stone). Encouraged warm saltwater gargle and follow up with PCP.   ROS: Please see the history of present  illness.    All other systems reviewed and are negative.   Studies Reviewed: Marland Kitchen   EKG Interpretation Date/Time:  Friday May 14 2023 10:48:52 EST Ventricular Rate:  77 PR Interval:  146 QRS Duration:  68 QT Interval:  366 QTC Calculation: 414 R Axis:   7  Text Interpretation: Normal sinus rhythm Normal ECG Confirmed by Gillian Shields ([id number]) on 05/14/2023 10:57:34 AM    Cardiac Studies & Procedures   ______________________________________________________________________________________________   STRESS TESTS  EXERCISE TOLERANCE TEST (ETT) 08/28/2014  Narrative  There was no ST segment deviation noted during stress.  No T wave inversion was noted during stress.   ECHOCARDIOGRAM  ECHOCARDIOGRAM COMPLETE 08/23/2014  Narrative *[facility]* [address] [place], [place] [address] [phone]  ------------------------------------------------------------------- Transthoracic Echocardiography  Patient:    Stephanie Trujillo MR #:       [id number] Study Date: 08/23/2014 Gender:     F Age:        48 Height:     157.5 cm Weight:     75.3 kg BSA:        1.84 m^2 Pt. Status: Room:  SONOGRAPHER  Dewitt Hoes, RDCS ATTENDING    Zoila Shutter MD ORDERING     Prentice Docker, MD REFERRING    Prentice Docker, MD PERFORMING   [facility], Outpatient  cc:  ------------------------------------------------------------------- LV EF: 60% -   65%  ------------------------------------------------------------------- Indications:      Chest pain (R07.9).  ------------------------------------------------------------------- History:  Risk factors:  PSVT.  ------------------------------------------------------------------- Study Conclusions  - Left ventricle: The cavity size was normal. Wall thickness was normal. Systolic function was normal. The estimated ejection fraction was in the range of 60% to 65%. Wall motion was normal; there were no regional wall motion  abnormalities. Left ventricular diastolic function parameters were normal. - Left atrium: The atrium was normal in size.  Impressions:  - Normal study.  Transthoracic echocardiography.  M-mode, complete 2D, spectral Doppler, and color Doppler.  Birthdate:  Patient birthdate: [birth year].  Age:  Patient is 58 yr old.  Sex:  Gender: female. BMI: 30.4 kg/m^2.  Blood pressure:     118/83  Patient status: Outpatient.  Study date:  Study date: 08/23/2014. Study time: 09:54 AM.  Location:  [facility]  -------------------------------------------------------------------  ------------------------------------------------------------------- Left ventricle:  The cavity size was normal. Wall thickness was normal. Systolic function was normal. The estimated ejection fraction was in the range of 60% to 65%. Wall motion was normal; there were no regional wall motion abnormalities. The transmitral flow pattern was normal. The deceleration time of the early transmitral flow velocity was normal. The pulmonary vein flow pattern was normal. The tissue Doppler parameters were normal. Left ventricular diastolic function parameters were normal.  ------------------------------------------------------------------- Aortic valve:   Structurally normal valve. Trileaflet. Cusp separation was normal.  Doppler:  Transvalvular velocity was within the normal range. There was no stenosis. There was no regurgitation.  ------------------------------------------------------------------- Aorta:  Aortic root: The aortic root was normal in size. Ascending aorta: The ascending aorta was normal in size.  ------------------------------------------------------------------- Mitral valve:   Structurally normal valve.   Leaflet separation was normal.  Doppler:  Transvalvular velocity was within the normal range. There was no evidence for stenosis. There was no regurgitation.    Peak gradient (D): 3 mm  Hg.  ------------------------------------------------------------------- Left atrium:  The atrium was normal in size.  ------------------------------------------------------------------- Atrial septum:  No defect or patent foramen ovale was identified.  ------------------------------------------------------------------- Right ventricle:  The cavity size was normal. Wall thickness was normal. Systolic function was normal.  ------------------------------------------------------------------- Pulmonic valve:    The valve appears to be grossly normal. Doppler:  There was no significant regurgitation.  ------------------------------------------------------------------- Tricuspid valve:   Doppler:  There was no significant regurgitation.  ------------------------------------------------------------------- Pulmonary artery:   The main pulmonary artery was normal-sized.  ------------------------------------------------------------------- Pericardium:  There was no pericardial effusion.  ------------------------------------------------------------------- Systemic veins: Inferior vena cava: The vessel was normal in size. The respirophasic diameter changes were in the normal range (>= 50%), consistent with normal central venous pressure.  ------------------------------------------------------------------- Measurements  Left ventricle                         Value        Reference LV ID, ED, PLAX chordal        (L)     37.9  mm     43 - 52 LV ID, ES, PLAX chordal        (L)     21.7  mm     23 - 38 LV fx shortening, PLAX chordal         43    %      >=29 LV PW thickness, ED                    10.1  mm     --------- IVS/LV PW ratio, ED  0.88         <=1.3 Stroke volume, 2D                      72    ml     --------- Stroke volume/bsa, 2D                  39    ml/m^2 --------- LV e&', lateral                         17.1  cm/s   --------- LV E/e&', lateral                        4.84         --------- LV e&', medial                          8.72  cm/s   --------- LV E/e&', medial                        9.48         --------- LV e&', average                         12.91 cm/s   --------- LV E/e&', average                       6.41         ---------  Ventricular septum                     Value        Reference IVS thickness, ED                      8.85  mm     ---------  LVOT                                   Value        Reference LVOT ID, S                             20    mm     --------- LVOT area                              3.14  cm^2   --------- LVOT peak velocity, S                  121   cm/s   --------- LVOT mean velocity, S                  75.4  cm/s   --------- LVOT VTI, S                            22.8  cm     --------- LVOT peak gradient, S                  6     mm Hg  ---------  Aorta  Value        Reference Aortic root ID, ED                     29    mm     ---------  Left atrium                            Value        Reference LA ID, A-P, ES                         26    mm     --------- LA ID/bsa, A-P                         1.41  cm/m^2 <=2.2 LA volume, S                           27    ml     --------- LA volume/bsa, S                       14.7  ml/m^2 --------- LA volume, ES, 1-p A4C                 27    ml     --------- LA volume/bsa, ES, 1-p A4C             14.7  ml/m^2 --------- LA volume, ES, 1-p A2C                 27    ml     --------- LA volume/bsa, ES, 1-p A2C             14.7  ml/m^2 ---------  Mitral valve                           Value        Reference Mitral E-wave peak velocity            82.7  cm/s   --------- Mitral A-wave peak velocity            53.7  cm/s   --------- Mitral deceleration time       (L)     141   ms     150 - 230 Mitral peak gradient, D                3     mm Hg  --------- Mitral E/A ratio, peak                 1.5          ---------  Systemic veins                          Value        Reference Estimated CVP                          3     mm Hg  ---------  Right ventricle                        Value        Reference RV s&', lateral, S  12.3  cm/s   ---------  Legend: (L)  and  (H)  mark values outside specified reference range.  ------------------------------------------------------------------- Prepared and Electronically Authenticated by  Zoila Shutter MD 2016-05-26T13:41:01    MONITORS  LONG TERM MONITOR (3-14 DAYS) 04/06/2018  Narrative 14 Day Event Monitor  Quality: Fair.  Baseline artifact. Predominant rhythm: sinus rhythm Average heart rate: 84 bpm Max heart rate: 162 bpm Min heart rate: 54 bpm  One episode of SVT (9 beats, max rate 162 bpm)  Tiffany C. Duke Salvia, MD, [facility] 05/07/2018 7:19 PM       ______________________________________________________________________________________________      Risk Assessment/Calculations:         STOP-Bang Score:         Physical Exam:   VS:  BP 116/82 (BP Location: Left Arm, Patient Position: Sitting, Cuff Size: Normal)   Pulse 77   Ht 5\' 3"  (1.6 m)   Wt 176 lb 1.6 oz (79.9 kg)   LMP  (LMP Unknown)   SpO2 95%   BMI 31.19 kg/m    Wt Readings from Last 3 Encounters:  05/14/23 176 lb 1.6 oz (79.9 kg)  12/06/21 170 lb (77.1 kg)  02/06/21 166 lb 6.4 oz (75.5 kg)    GEN: Well nourished, well developed in no acute distress NECK: No JVD; No carotid bruits CARDIAC: RRR, no murmurs, rubs, gallops RESPIRATORY:  Clear to auscultation without rales, wheezing or rhonchi  ABDOMEN: Soft, non-tender, non-distended EXTREMITIES:  No edema; No deformity   ASSESSMENT AND PLAN: .    Chest pain in adult - EKG today NSR no acute ST/T wave changes. Reports 2 episodes of chest pain which were midsternal. More severe episode described as the worse pain she's ever had. Plan for cardiac CTA. She is aware of use of nitroglycerin during the study. Metoprolol  tartrate 100mg  two hours prior to CTA. 05/03/23 creatinine 0.93, GFR 73.  HTN- BP controlled and not requiring antihypertensive agent.   Paroxysmal SVT- No recent SVT. Has never had to take her Diltiazem, does need refill as hers is expired which we will provide today. Notes recent "peculiar-ness" with sensation of heart "thud" periodically likely PVC. Recommend staying well hydrated, managing stress well, continued avoidance of caffeine/etoh.  Snoring- Prior sleep study with no sleep apnea.        Dispo: follow up in 2-3 months  Signed, Alver Sorrow, NP

## 2023-05-15 ENCOUNTER — Other Ambulatory Visit (HOSPITAL_BASED_OUTPATIENT_CLINIC_OR_DEPARTMENT_OTHER): Payer: Self-pay | Admitting: Family

## 2023-05-15 DIAGNOSIS — R079 Chest pain, unspecified: Secondary | ICD-10-CM

## 2023-05-18 ENCOUNTER — Telehealth (HOSPITAL_BASED_OUTPATIENT_CLINIC_OR_DEPARTMENT_OTHER): Payer: Self-pay | Admitting: Cardiovascular Disease

## 2023-05-18 NOTE — Telephone Encounter (Signed)
Patient wanted to give a heads-up that her surgeon from [facility] will be reaching out to get clearance for her to have an endoscopy and she will need this process expedited as [place].

## 2023-05-18 NOTE — Telephone Encounter (Signed)
Once our office receives the preop clearance request, our preop team will proceed appropriately.

## 2023-05-19 NOTE — Telephone Encounter (Signed)
I will update the pt thru MY CHART that we have not yet received the clearance request. If she will please have the requesting office fax request to [phone] attn: preop team.

## 2023-05-21 ENCOUNTER — Other Ambulatory Visit: Payer: BC Managed Care – PPO

## 2023-05-21 ENCOUNTER — Ambulatory Visit
Admission: RE | Admit: 2023-05-21 | Discharge: 2023-05-21 | Disposition: A | Discharge status: Home / Self Care | Payer: BC Managed Care – PPO | Source: Ambulatory Visit | Attending: Obstetrics and Gynecology | Admitting: Obstetrics and Gynecology

## 2023-05-21 ENCOUNTER — Other Ambulatory Visit: Payer: Self-pay | Admitting: Obstetrics and Gynecology

## 2023-05-21 DIAGNOSIS — N6310 Unspecified lump in the right breast, unspecified quadrant: Secondary | ICD-10-CM

## 2023-05-21 DIAGNOSIS — R928 Other abnormal and inconclusive findings on diagnostic imaging of breast: Secondary | ICD-10-CM

## 2023-05-21 DIAGNOSIS — N6320 Unspecified lump in the left breast, unspecified quadrant: Secondary | ICD-10-CM

## 2023-05-26 ENCOUNTER — Telehealth (HOSPITAL_BASED_OUTPATIENT_CLINIC_OR_DEPARTMENT_OTHER): Payer: Self-pay | Admitting: *Deleted

## 2023-05-26 NOTE — Telephone Encounter (Signed)
   Patient Name: Stephanie Trujillo  DOB: 1966-03-13 MRN: [id number]  Primary Cardiologist: Chilton Si, MD  Chart reviewed as part of pre-operative protocol coverage.  Patient was recently seen in the office on 05/14/2023 and reported intermittent midsternal chest pain.  She is pending coronary CT angiogram.  This will need to be completed prior to surgical clearance.  I will route this recommendation to the requesting party via Epic fax function and remove from pre-op pool.  Please call with questions.  Joylene Grapes, NP 05/26/2023, 10:33 AM

## 2023-05-26 NOTE — Telephone Encounter (Signed)
   Pre-operative Risk Assessment    Patient Name: Stephanie Trujillo  DOB: Dec 11, 1965 MRN: [id number]   Date of last office visit: 02/06/2021 Date of next office visit: 07/26/2023  Request for Surgical Clearance    Procedure:   Endoscopy  Date of Surgery:  Clearance 06/04/23                                  Surgeon:  Dr. Shirlean Mylar Group or Practice Name:  [facility] Phone number:  [phone] Fax number:  [phone]   Type of Clearance Requested:   - Medical    Type of Anesthesia:   Not Indicated   Additional requests/questions:    Signed, Emmit Pomfret   05/26/2023, 10:24 AM

## 2023-07-16 ENCOUNTER — Telehealth (HOSPITAL_COMMUNITY): Payer: Self-pay | Admitting: *Deleted

## 2023-07-16 ENCOUNTER — Encounter (HOSPITAL_COMMUNITY): Payer: Self-pay

## 2023-07-16 NOTE — Telephone Encounter (Signed)
 Attempted to call patient regarding upcoming cardiac CT appointment. Left message on voicemail with name and callback number  Larey Brick RN Navigator Cardiac Imaging [facility] [phone] Office [phone] Cell

## 2023-07-19 ENCOUNTER — Ambulatory Visit: Admission: RE | Admit: 2023-07-19 | Source: Ambulatory Visit

## 2023-07-19 ENCOUNTER — Telehealth: Payer: Self-pay | Admitting: Cardiovascular Disease

## 2023-07-19 NOTE — Telephone Encounter (Signed)
 Returned call to patient,   Patient states Stephanie Trujillo had ordered a CT for her. Originally she had issues with scheduling. They had pt scheduled for today and it was cancelled. She states she hasn't seen Dr. Theodis Fiscal in a long time and she would like to see her. She notes that she knows that Dr. Theodis Fiscal isn't available until September which she finds odd. 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 She said this has never had this happen before. She states she wants to know if there is any other testing that she can have done in place of the CT. She states she has been reading about at Stress test or imaging of the heart and lungs. She states if these are not optimal she can do the CT.   She also states if she needs to have the CT done, is it possible to have it done in [place].

## 2023-07-19 NOTE — Telephone Encounter (Signed)
 Returned call to patient, reviewed recommendations with pt.

## 2023-07-19 NOTE — Telephone Encounter (Signed)
 Pt would like a c/b in regards to the CT test she has coming up and also would like to let Dr. 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 Please advise

## 2023-07-19 NOTE — Telephone Encounter (Signed)
 Recommend either reschedule CT or bring into clinic to discuss alternatives.   It was cancelled for today due to lack of staffing as it requires a nurse to be able to administer medications (nitroglycerin) for the study. This is not a frequent occurrence, but does happen on occasion. We can work to get it rescheduled in [place] at [facility].   Fitzgerald Dunne S Kamali Sakata, NP

## 2023-07-19 NOTE — Telephone Encounter (Signed)
 Is there a reason in particular she does not want CT?  Cardiac CTA is the first line recommendation for chest pain from the American Heart Association and American College of Cardiology. It provides imaging of all of the heart arteries as well as images of the lungs which she requested.   We can schedule her for visit this week to discuss imaging modalities again with me or Moira Andrews if she prefers.  Crew Goren S Zohan Shiflet, NP

## 2023-07-26 ENCOUNTER — Ambulatory Visit (HOSPITAL_BASED_OUTPATIENT_CLINIC_OR_DEPARTMENT_OTHER): Payer: BC Managed Care – PPO | Admitting: Nurse Practitioner

## 2023-09-17 NOTE — Progress Notes (Signed)
 Rheumatology Clinic Follow-up Note   Assessment/Plan:   Stephanie Trujillo is a 58 y.o.  female with a PMHx of recurrent HSV (R arm only), traction alopecia, eczema, seborrheic dermatitis, paroxysmal SVT, and HTN who presents today for follow-up of positive serologies (+ANA, +RF, +CCP) in setting of a strong FHx of RA.  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 Otherwise, remains without evidence of a developing rheumatic disease. - CBC w/ diff, Cr, hepatic panel - C3, C4, dsDNA, RF, CCP, ESR, CRP - U/A and UPC  SOB: Predominately exertional. We discussed lower suspicion for ILD or pHTN but will screen with below tests. - CXR - PFTs - TTE  Hair Loss: Previous Dx of traction alopecia, will refer to derm. - Derm referral [facility])  No follow-ups on file.  Ozell Holt, MD  Assistant Professor of Medicine Department of Medicine/Division of Rheumatology [facility]  Primary Care Provider: Day, Tex Ao, MD  HPI: Stephanie Trujillo is a 58 y.o.  female with a PMHx of recurrent HSV (R arm only), traction alopecia, eczema, seborrheic dermatitis, paroxysmal SVT, and HTN who presents today for follow-up of positive serologies (+ANA, +RF, +CCP) in setting of a strong FHx of RA.  Today She says that she has had a marked difference in her breathing. 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 She had to stop and pause at times. She has noticed that it is more challenging to do things she used to do such as exercise. Can't go as far with her elliptical. Having to pause more. This has been for past couple months. Chest pressure for past 9-10  months. Saw cardiologist and has plans to do a stress test.  Having some other things that re coming up agains ome. Bottom of her R foot is painful, feels more burning than   Vision changes come and go. Seems a little more blurry. Ophthalmology visits were reassuring.  Has not started [facility], she found out that thyroid issues run in the family so this has given her pause.  Had outbreak of shingles on R arm.   Disease history She has a history of recurrent (up to 8 times/ year) episodes of HSV outbreaks along her inner right arm. During these outbreaks, she has ascending nerve pain to the neck and tracks to the contralateral arm. She also has head aches during these outbreaks. She has noticed proximal muscle weakness of the right arm with extension. She has been seeing an ID doctor in [place], [place] who think she may have a component of CNS involvement due to the head aches as well as intermittent head jerking and left eye twitching. She has been on Valtrex  since 2007 and notices feeling poorly if she misses a dose. She is now following with [facility] neurology.              She later developed foot and hip pain with activity as well as bilateral hand stiffness around 2020.  She was first seen at [facility] rheumatology in Sept 2021. Serologies showed + ANA/RF/CCP with negative dsDNA and ENA. Hand and foot xrays did not show signs of inflammatory arthritis.  She did not have inflammatory arthritis on exam.   Outside labs + RF 40 + CCP 18 + ANA 1:160 RNP negative dsDNA negative SSA/B negative AntiSm negative  C3 and C4 normal   Review of Systems: Positive findings noted above, otherwise a 14 point review of systems was reviewed and negative  Past Medical, Surgical, Family and Social History reviewed and updated per EMR   Allergies: Codeine  Medications:   Current Outpatient Medications:  .  acyclovir (ZOVIRAX) 5 % ointment, Apply topically every three (3) hours., Disp: 15 g, Rfl: 3 .   clobetasoL (TEMOVATE) 0.05 % ointment, clobetasol 0.05 % topical ointment, Disp: , Rfl:  .  dilTIAZem  (CARDIZEM ) 30 MG tablet, Take 1 tablet (30 mg total) by mouth daily as needed., Disp: , Rfl:  .  fluocinonide  (LIDEX ) 0.05 % cream, Apply topically., Disp: , Rfl:  .  multivitamin (TAB-A-VITE/THERAGRAN) per tablet, Take 1 tablet by mouth daily., Disp: , Rfl:  .  mv,Ca,ir,Mn-FA-cho-gel-ino-PAB (BODY, HAIR, SKIN AND NAILS) 3-133 mg-mcg cap, , Disp: , Rfl:  .  triamcinolone (KENALOG) 0.1 % ointment, triamcinolone acetonide 0.1 % topical ointment, Disp: , Rfl:  .  UNABLE TO FIND, Biotin Hair/Nails, Disp: , Rfl:    Objective Vitals:   09/17/23 1150  Weight: 79.2 kg (174 lb 9.6 oz)      Physical Exam General: well appearing, no acute distress, pleasant Eyes: EOMI, normal conjunctivae  ENT: MMM.  Oropharynx without any erythema or exudate.  No oral or nasal ulcers. Neck/Scalp: supple. No cervical lymphadenopathy, hair thinning along hair line uniformly. No focal/pathcy alopecia. Cardiovascular: Radial pulses 2+ and symmetric, regular. Pulmonary: Nl WOB on RA, clear. Skin: On clothed exam; no rash, lesions, breakdown. No purpura or petechiae. No digital ulcers.  Extremities: Warm and well perfused, no cyanosis, clubbing or edema Musculoskeletal: No synovitis or tenderness to palpation in the hands, wrists, elbows, shoulders, knees, ankles, or feet. Full ROM throughout.  Neurologic: Cranial nerves grossly intact, strength 5/5 throughout.  Normal sensation Psychiatric: Normal mood and affect.  I personally spent 34 minutes face-to-face and non-face-to-face in the care of this patient, which includes all pre, intra, and post visit time on the date of service.  All documented time was specific to the E/M visit and does not include any procedures that may have been performed.

## 2023-09-28 ENCOUNTER — Encounter (HOSPITAL_COMMUNITY): Payer: Self-pay

## 2023-09-30 ENCOUNTER — Ambulatory Visit (HOSPITAL_COMMUNITY): Admission: RE | Admit: 2023-09-30 | Source: Ambulatory Visit

## 2023-10-04 ENCOUNTER — Encounter (HOSPITAL_BASED_OUTPATIENT_CLINIC_OR_DEPARTMENT_OTHER): Payer: Self-pay | Admitting: Cardiovascular Disease

## 2023-10-04 ENCOUNTER — Encounter (HOSPITAL_BASED_OUTPATIENT_CLINIC_OR_DEPARTMENT_OTHER): Payer: Self-pay

## 2023-10-04 NOTE — Telephone Encounter (Signed)
Duplicate message- see other encounter.

## 2023-10-06 ENCOUNTER — Encounter (HOSPITAL_BASED_OUTPATIENT_CLINIC_OR_DEPARTMENT_OTHER): Payer: Self-pay

## 2023-10-06 ENCOUNTER — Encounter (HOSPITAL_BASED_OUTPATIENT_CLINIC_OR_DEPARTMENT_OTHER): Payer: Self-pay | Admitting: Cardiovascular Disease

## 2023-10-06 NOTE — Telephone Encounter (Signed)
 Patient sent separate mychart encounter- see separate encounter for discussion

## 2023-10-07 ENCOUNTER — Ambulatory Visit (HOSPITAL_COMMUNITY)
Admission: RE | Admit: 2023-10-07 | Discharge: 2023-10-07 | Disposition: A | Discharge status: Home / Self Care | Source: Ambulatory Visit | Attending: Family | Admitting: Family

## 2023-10-07 DIAGNOSIS — R079 Chest pain, unspecified: Secondary | ICD-10-CM | POA: Diagnosis present

## 2023-10-07 MED ORDER — IOHEXOL 350 MG/ML SOLN
100.0000 mL | Freq: Once | INTRAVENOUS | Status: AC | PRN
  Administered 2023-10-07: 100 mL via INTRAVENOUS

## 2023-10-07 MED ORDER — NITROGLYCERIN 0.4 MG SL SUBL
0.8000 mg | SUBLINGUAL_TABLET | Freq: Once | SUBLINGUAL | Status: AC
Start: 2023-10-07 — End: 2023-10-07
  Administered 2023-10-07: 0.8 mg via SUBLINGUAL

## 2023-10-08 ENCOUNTER — Ambulatory Visit (HOSPITAL_BASED_OUTPATIENT_CLINIC_OR_DEPARTMENT_OTHER): Payer: Self-pay | Admitting: Family

## 2023-11-01 ENCOUNTER — Other Ambulatory Visit: Payer: Self-pay | Admitting: Obstetrics and Gynecology

## 2023-11-01 DIAGNOSIS — Z1231 Encounter for screening mammogram for malignant neoplasm of breast: Secondary | ICD-10-CM

## 2023-11-25 ENCOUNTER — Ambulatory Visit
Admission: RE | Admit: 2023-11-25 | Discharge: 2023-11-25 | Disposition: A | Discharge status: Home / Self Care | Source: Ambulatory Visit | Attending: Obstetrics and Gynecology | Admitting: Obstetrics and Gynecology

## 2023-11-25 DIAGNOSIS — Z1231 Encounter for screening mammogram for malignant neoplasm of breast: Secondary | ICD-10-CM

## 2024-01-05 IMAGING — MG MM DIGITAL SCREENING BILAT W/ TOMO AND CAD
8 series · 8 of 24 positions shown · non-contrast
Comparison: Previous exam(s).

CLINICAL DATA: Screening.

EXAM:
DIGITAL SCREENING BILATERAL MAMMOGRAM WITH TOMOSYNTHESIS AND CAD
TECHNIQUE: Bilateral screening digital craniocaudal and mediolateral oblique
mammograms were obtained. Bilateral screening digital breast
tomosynthesis was performed. The images were evaluated with
computer-aided detection.

[L CC synth-2D]
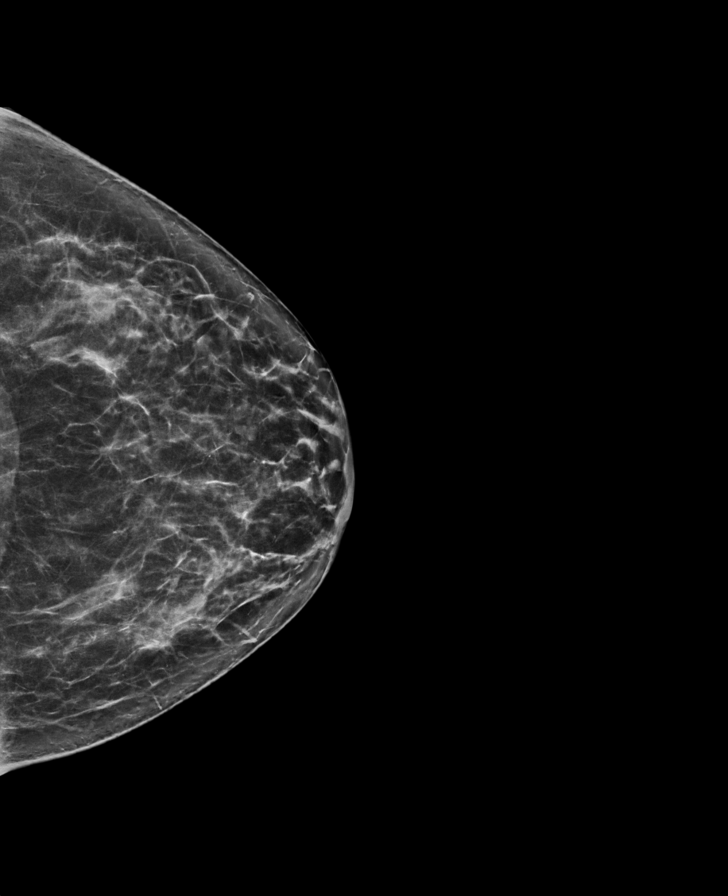

[L MLO synth-2D]
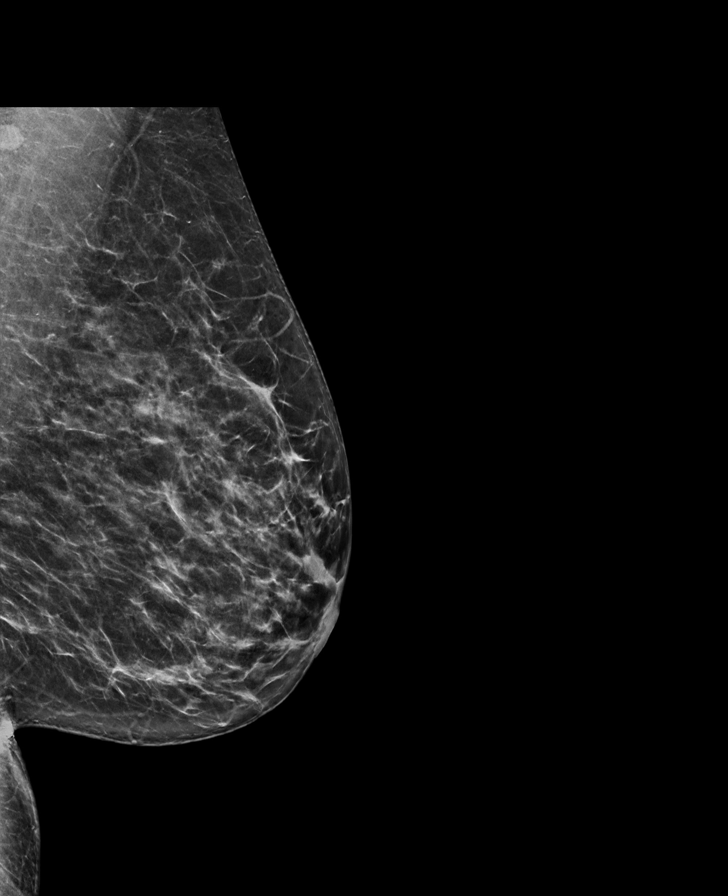

[R MLO synth-2D]
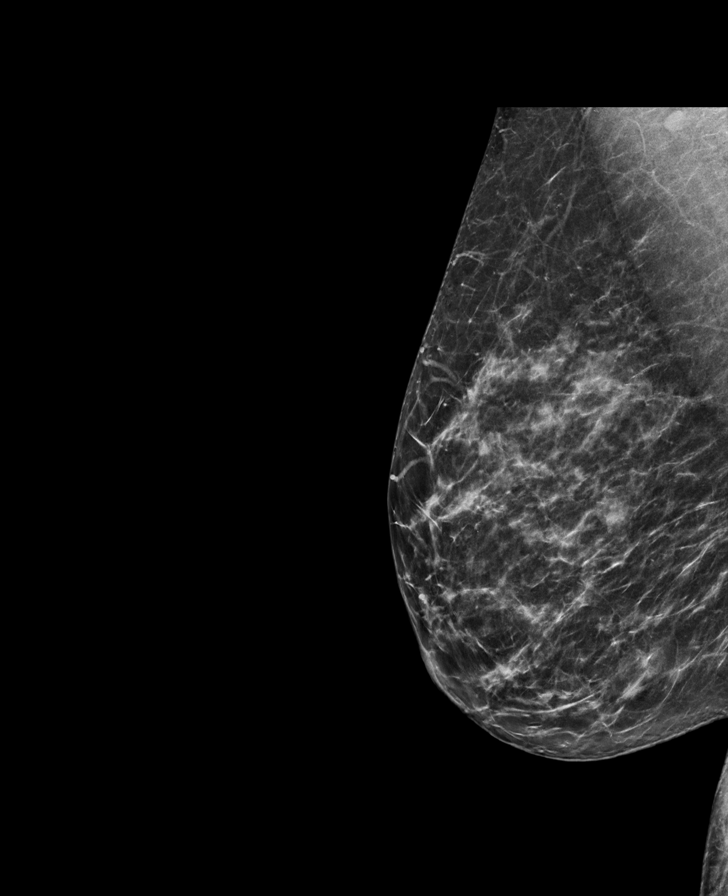

[R CC synth-2D]
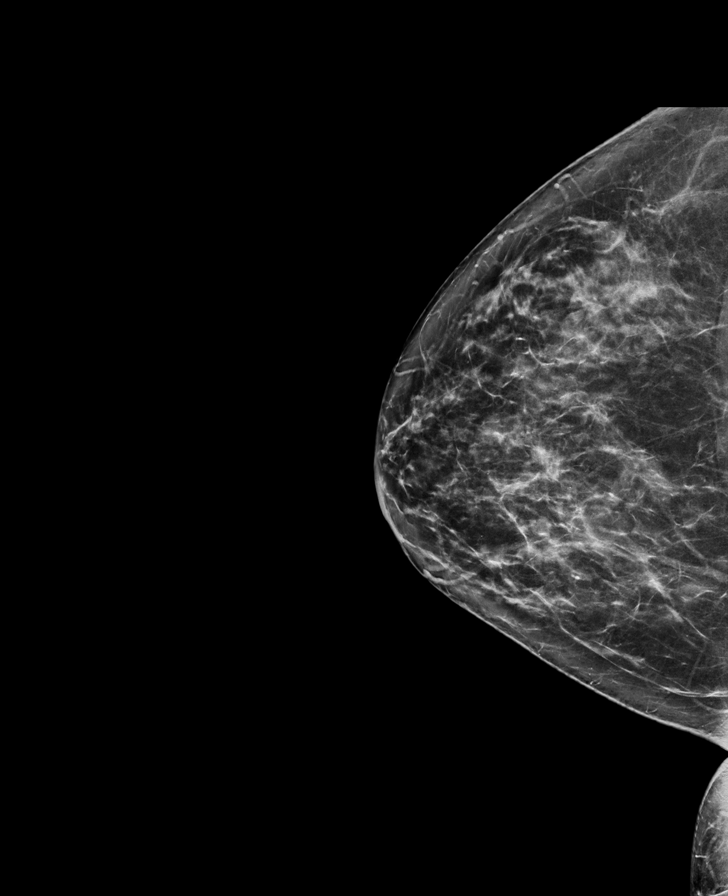

[R MLO tomo · tomo slice 35/69.0]
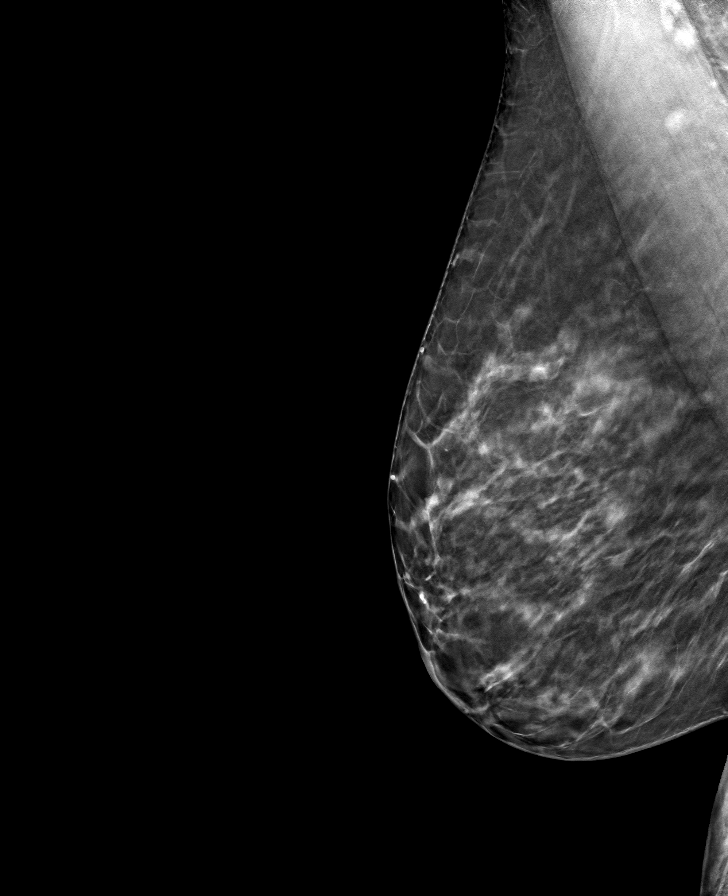

[L CC tomo · tomo slice 37/72.0]
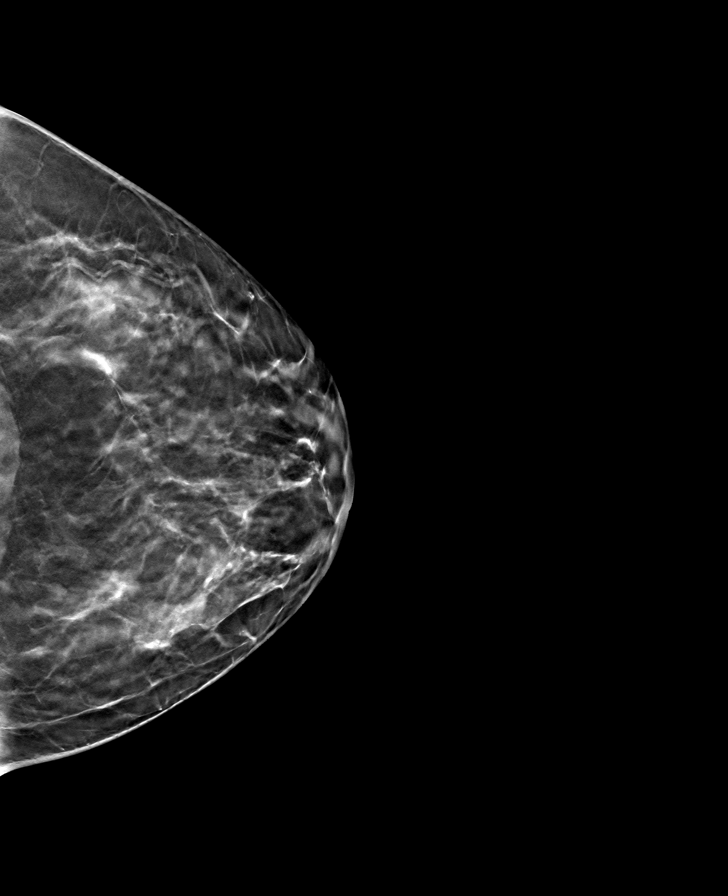

[L MLO tomo · tomo slice 35/68.0]
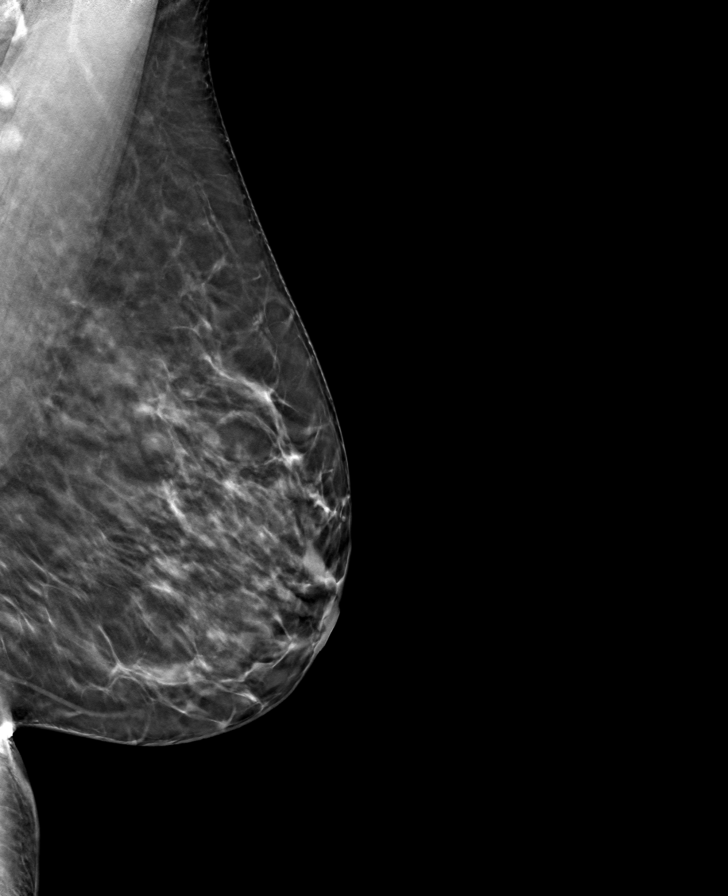

[R CC tomo · tomo slice 39/76.0]
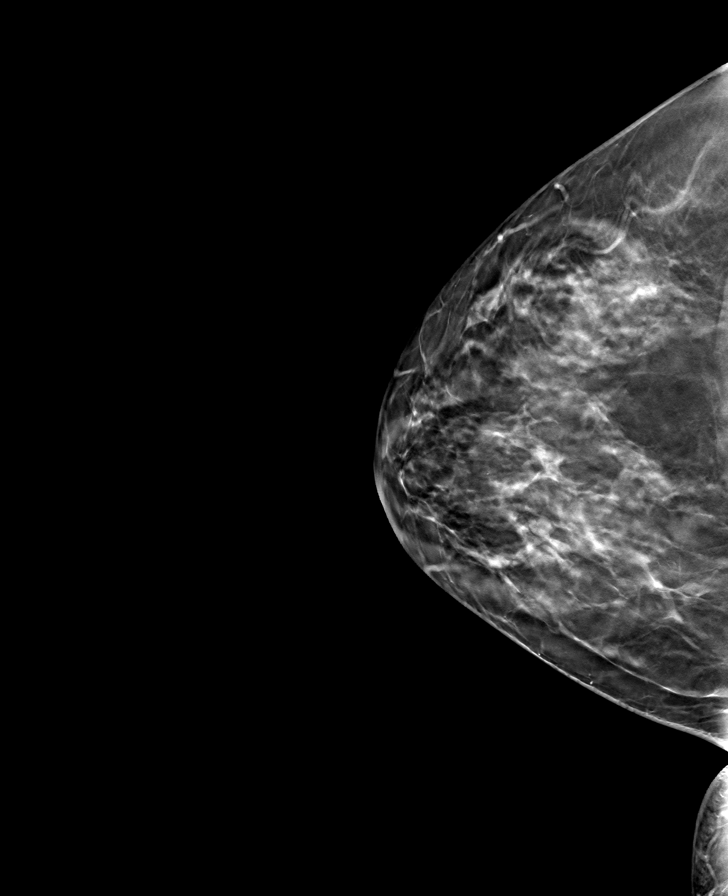

[8 of 24 positions shown; findings below may reference images not displayed]

ACR Breast Density Category c: The breast tissue is heterogeneously
dense, which may obscure small masses.
FINDINGS: There are no findings suspicious for malignancy.
IMPRESSION: No mammographic evidence of malignancy. A result letter of this
screening mammogram will be mailed directly to the patient.

RECOMMENDATION:
Screening mammogram in one year. (Code:Q3-W-BC3)

BI-RADS CATEGORY  1: Negative.
